# Patient Record
Sex: Male | Born: 1995 | Race: Black or African American | Hispanic: No | Marital: Single | Smoking: Current every day smoker
Health system: Southern US, Community
[De-identification: ages and names within clinical notes are randomized; demographics above are authoritative.]

## PROBLEM LIST (undated history)

## (undated) DIAGNOSIS — Q8500 Neurofibromatosis, unspecified: Secondary | ICD-10-CM

## (undated) HISTORY — PX: WISDOM TOOTH EXTRACTION: SHX21

---
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
## 2017-01-29 ENCOUNTER — Encounter (HOSPITAL_COMMUNITY): Payer: Self-pay | Admitting: Emergency Medicine

## 2017-01-29 ENCOUNTER — Emergency Department (HOSPITAL_COMMUNITY)
Admission: EM | Admit: 2017-01-29 | Discharge: 2017-01-29 | Disposition: A | Discharge status: Home / Self Care | Payer: Medicaid Other | Attending: Emergency Medicine | Admitting: Emergency Medicine

## 2017-01-29 DIAGNOSIS — X500XXA Overexertion from strenuous movement or load, initial encounter: Secondary | ICD-10-CM | POA: Diagnosis not present

## 2017-01-29 DIAGNOSIS — Z87891 Personal history of nicotine dependence: Secondary | ICD-10-CM | POA: Insufficient documentation

## 2017-01-29 DIAGNOSIS — Y929 Unspecified place or not applicable: Secondary | ICD-10-CM | POA: Insufficient documentation

## 2017-01-29 DIAGNOSIS — Y990 Civilian activity done for income or pay: Secondary | ICD-10-CM | POA: Diagnosis not present

## 2017-01-29 DIAGNOSIS — S46911A Strain of unspecified muscle, fascia and tendon at shoulder and upper arm level, right arm, initial encounter: Secondary | ICD-10-CM | POA: Diagnosis not present

## 2017-01-29 DIAGNOSIS — Z79899 Other long term (current) drug therapy: Secondary | ICD-10-CM | POA: Insufficient documentation

## 2017-01-29 DIAGNOSIS — S4991XA Unspecified injury of right shoulder and upper arm, initial encounter: Secondary | ICD-10-CM | POA: Diagnosis present

## 2017-01-29 DIAGNOSIS — Y939 Activity, unspecified: Secondary | ICD-10-CM | POA: Insufficient documentation

## 2017-01-29 MED ORDER — METHOCARBAMOL 500 MG PO TABS: 500.0000 mg | ORAL_TABLET | Freq: Two times a day (BID) | ORAL | 0 refills | Status: DC | PRN

## 2017-01-29 MED ORDER — METHOCARBAMOL 500 MG PO TABS
500.0000 mg | ORAL_TABLET | Freq: Once | ORAL | Status: AC
  Administered 2017-01-29: 500 mg via ORAL
  Filled 2017-01-29: qty 1

## 2017-01-29 MED ORDER — IBUPROFEN 800 MG PO TABS
800.0000 mg | ORAL_TABLET | Freq: Once | ORAL | Status: AC
  Administered 2017-01-29: 800 mg via ORAL
  Filled 2017-01-29: qty 1

## 2017-01-29 MED ORDER — IBUPROFEN 800 MG PO TABS: 800.0000 mg | ORAL_TABLET | Freq: Three times a day (TID) | ORAL | 0 refills | Status: DC | PRN

## 2017-01-29 NOTE — ED Triage Notes (Signed)
Pt states he thinks he pulled a muscle in his right arm yesterday while lifting a box at work  Pt states he has had pain in his right upper arm since

## 2017-01-29 NOTE — Discharge Instructions (Signed)
It was my pleasure taking care of you today!   Ibuprofen as needed for pain. Robaxin is your muscle relaxer.  Rest. Ice or heat to area.  Follow up with your primary doctor if symptoms do not improve over the next week.  Return to ER for new or worsening symptoms, any additional concerns.  COLD THERAPY DIRECTIONS:  Ice or gel packs can be used to reduce both pain and swelling. Ice is the most helpful within the first 24 to 48 hours after an injury or flareup from overusing a muscle or joint.  Ice is effective, has very few side effects, and is safe for most people to use.   If you expose your skin to cold temperatures for too long or without the proper protection, you can damage your skin or nerves. Watch for signs of skin damage due to cold.   HOME CARE INSTRUCTIONS  Follow these tips to use ice and cold packs safely.  Place a dry or damp towel between the ice and skin. A damp towel will cool the skin more quickly, so you may need to shorten the time that the ice is used.  For a more rapid response, add gentle compression to the ice.  Ice for no more than 10 to 20 minutes at a time. The bonier the area you are icing, the less time it will take to get the benefits of ice.  Check your skin after 5 minutes to make sure there are no signs of a poor response to cold or skin damage.  Rest 20 minutes or more in between uses.  Once your skin is numb, you can end your treatment. You can test numbness by very lightly touching your skin. The touch should be so light that you do not see the skin dimple from the pressure of your fingertip. When using ice, most people will feel these normal sensations in this order: cold, burning, aching, and numbness.

## 2017-01-29 NOTE — ED Notes (Signed)
PT DISCHARGED. INSTRUCTIONS AND PRESCRIPTIONS GIVEN. AAOX4. PT IN NO APPARENT DISTRESS WITH MILD PAIN. THE OPPORTUNITY TO ASK QUESTIONS WAS PROVIDED. 

## 2017-01-29 NOTE — ED Provider Notes (Signed)
[facility]-EMERGENCY DEPT Provider Note   CSN: [id number] Arrival date & time: 01/29/17  1925     History   Chief Complaint Chief Complaint  Patient presents with  . Arm Pain    HPI Adam Guzman is a 21 y.o. male.  The history is provided by the patient and medical records. No language interpreter was used.  Arm Pain    Adam Guzman is a 21 y.o. male who presents to the Emergency Department complaining of acute onset of right anterior shoulder / chest wall pain described as ache which began yesterday while lifting a box much too heavy for him at work. Patient states that it feels like he has pulled a muscle. He has tried otc pain medication with no relief. Worse when lifting or raising arm above head. Better when still. No numbness or tingling. No neck or back pain.   Past Medical History:  Diagnosis Date  . Neurofibromatosis [facility])     Patient Active Problem List   Diagnosis Date Noted  . Migraine without aura and without status migrainosus, not intractable 01/11/2016  . Neck pain on right side 07/13/2015  . Delayed milestones 12/10/2013  . 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 Problems with learning 12/10/2013  . GYNECOMASTIA, UNILATERAL 09/02/2007  . ECZEMA, ATOPIC DERMATITIS 10/24/2006    History reviewed. No pertinent surgical history.     Home Medications    Prior to Admission medications   Medication Sig Start Date End Date Taking? Authorizing Provider  amoxicillin (AMOXIL) 500 MG capsule Take 1 capsule (500 mg total) by mouth 2 (two) times daily. 09/01/16   Riki Sheer, PA-C  ibuprofen (ADVIL,MOTRIN) 800 MG tablet Take 1 tablet (800 mg total) by mouth every 8 (eight) hours as needed for moderate pain. 01/29/17   Ward, Chase Picket, PA-C  methocarbamol (ROBAXIN) 500 MG tablet Take 1 tablet (500 mg total) by mouth 2 (two) times daily as needed for muscle spasms. 01/29/17   Ward, Chase Picket, PA-C  tiZANidine (ZANAFLEX)  4 MG tablet Take 1 tablet for severe headache 07/04/16   Deetta Perla, MD    Family History Family History  Problem Relation Age of Onset  . Cancer Father        Died at 96    Social History Social History  Substance Use Topics  . Smoking status: Former Games developer  . Smokeless tobacco: Never Used  . Alcohol use No     Allergies   Patient has no known allergies.   Review of Systems Review of Systems  Musculoskeletal: Positive for myalgias. Negative for arthralgias.  Neurological: Negative for weakness and numbness.     Physical Exam Updated Vital Signs BP 122/61 (BP Location: Left Arm)   Pulse (!) 58   Temp 98.2 F (36.8 C) (Oral)   Resp 16   Ht 5\' 10"  (1.778 m)   Wt 74.4 kg (164 lb)   SpO2 100%   BMI 23.53 kg/m   Physical Exam  Constitutional: He appears well-developed and well-nourished. No distress.  HENT:  Head: Normocephalic and atraumatic.  Neck: Neck supple.  Cardiovascular: Normal rate, regular rhythm and normal heart sounds.   No murmur heard. Pulmonary/Chest: Effort normal and breath sounds normal. No respiratory distress. He has no wheezes. He has no rales.  Musculoskeletal:       Arms: Right shoulder with no bony tenderness. TTP as depicted in image. Full ROM. Negative Neer's. No swelling, erythema or ecchymosis present. No step-off,  crepitus, or deformity appreciated. 5/5 muscle strength of bilateral UE. 2+ radial pulse, sensation intact and all compartments soft.  Neurological: He is alert.  Skin: Skin is warm and dry.  Nursing note and vitals reviewed.    ED Treatments / Results  Labs (all labs ordered are listed, but only abnormal results are displayed) Labs Reviewed - No data to display  EKG  EKG Interpretation None       Radiology No results found.  Procedures Procedures (including critical care time)  Medications Ordered in ED Medications  ibuprofen (ADVIL,MOTRIN) tablet 800 mg (not administered)  methocarbamol  (ROBAXIN) tablet 500 mg (not administered)     Initial Impression / Assessment and Plan / ED Course  I have reviewed the triage vital signs and the nursing notes.  Pertinent labs & imaging results that were available during my care of the patient were reviewed by me and considered in my medical decision making (see chart for details).    Hughey L Sprunger is a 21 y.o. male who presents to ED for shoulder / chest wall pain which began yesterday at work when trying to lift a box much too heavy for him. On exam, patient with tenderness to palpation of anterior shoulder musculature. No bony tenderness. Full ROM and NVI. Offered x-ray, but do not believe any bony abnormality. Patient defers x-ray at this time. Pain managed in ED. Patient advised to follow up with PCP if symptoms persist for further evaluation. Rx for ibuprofen and robaxin given. Work note for tomorrow provided as he does heavy lifting. Return precautions discussed. All questions answered.   Final Clinical Impressions(s) / ED Diagnoses   Final diagnoses:  Muscle strain of right shoulder region, initial encounter    New Prescriptions New Prescriptions   IBUPROFEN (ADVIL,MOTRIN) 800 MG TABLET    Take 1 tablet (800 mg total) by mouth every 8 (eight) hours as needed for moderate pain.   METHOCARBAMOL (ROBAXIN) 500 MG TABLET    Take 1 tablet (500 mg total) by mouth 2 (two) times daily as needed for muscle spasms.     Ward, Chase PicketJaime Pilcher, PA-C 01/29/17 2226    Lorre NickAllen, Anthony, MD 02/01/17 [phone]

## 2017-02-01 ENCOUNTER — Encounter (HOSPITAL_COMMUNITY): Payer: Self-pay | Admitting: *Deleted

## 2017-02-01 ENCOUNTER — Emergency Department (HOSPITAL_COMMUNITY)
Admission: EM | Admit: 2017-02-01 | Discharge: 2017-02-01 | Disposition: A | Discharge status: Home / Self Care | Payer: Medicaid Other | Attending: Emergency Medicine | Admitting: Emergency Medicine

## 2017-02-01 ENCOUNTER — Emergency Department (HOSPITAL_COMMUNITY): Payer: Medicaid Other

## 2017-02-01 DIAGNOSIS — Z79899 Other long term (current) drug therapy: Secondary | ICD-10-CM | POA: Diagnosis not present

## 2017-02-01 DIAGNOSIS — Z87891 Personal history of nicotine dependence: Secondary | ICD-10-CM | POA: Diagnosis not present

## 2017-02-01 DIAGNOSIS — X500XXA Overexertion from strenuous movement or load, initial encounter: Secondary | ICD-10-CM | POA: Insufficient documentation

## 2017-02-01 DIAGNOSIS — M25511 Pain in right shoulder: Secondary | ICD-10-CM | POA: Diagnosis not present

## 2017-02-01 DIAGNOSIS — Y929 Unspecified place or not applicable: Secondary | ICD-10-CM | POA: Diagnosis not present

## 2017-02-01 DIAGNOSIS — Y990 Civilian activity done for income or pay: Secondary | ICD-10-CM | POA: Insufficient documentation

## 2017-02-01 DIAGNOSIS — Y939 Activity, unspecified: Secondary | ICD-10-CM | POA: Insufficient documentation

## 2017-02-01 MED ORDER — MELOXICAM 7.5 MG PO TABS: 7.5000 mg | ORAL_TABLET | Freq: Every day | ORAL | 0 refills | Status: DC

## 2017-02-01 NOTE — ED Triage Notes (Signed)
Pt complains of right shoulder pain for the past 4 days. Pt was seen 3 days ago and prescribed ibuprofen and robaxin, which he states has not been helping. 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

## 2017-02-01 NOTE — ED Provider Notes (Signed)
[facility]-EMERGENCY DEPT Provider Note   CSN: [id number] Arrival date & time: 02/01/17  1736  By signing my name below, I, Rosario AdieWilliam Andrew Hiatt, attest that this documentation has been prepared under the direction and in the presence of Mayah Urquidi, PA-C.  Electronically Signed: Rosario AdieWilliam Andrew Hiatt, ED Scribe. 02/01/17. 7:06 PM.  History   Chief Complaint Chief Complaint  Patient presents with  . Shoulder Pain   The history is provided by the patient and medical records. No language interpreter was used.    HPI Comments: Adam Guzman is a 21 y.o. male who presents to the Emergency Department complaining of persistent right shoulder beginning four days ago. Per pt, he lifts heavy objects for work and Monday following work his pain was present. No significant trauma or injury. He was seen in the ED for same three days for which he was prescribed Robaxin and Ibuprofen which he has been taking without significant relief of his pain. His pain is worse with ROM of the joint. No recent re-injury since being seen in the ED. No h/o prior injury to the shoulder or any procedures done. He denies numbness, or any other associated symptoms.    Past Medical History:  Diagnosis Date  . Neurofibromatosis [facility](HCC)    Patient Active Problem List   Diagnosis Date Noted  . Migraine without aura and without status migrainosus, not intractable 01/11/2016  . Neck pain on right side 07/13/2015  . Delayed milestones 12/10/2013  . 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 Problems with learning 12/10/2013  . GYNECOMASTIA, UNILATERAL 09/02/2007  . ECZEMA, ATOPIC DERMATITIS 10/24/2006   History reviewed. No pertinent surgical history.  Home Medications    Prior to Admission medications   Medication Sig Start Date End Date Taking? Authorizing Provider  amoxicillin (AMOXIL) 500 MG capsule Take 1 capsule (500 mg total) by mouth 2 (two) times daily. 09/01/16   Riki SheerYoung, Michelle G,  PA-C  ibuprofen (ADVIL,MOTRIN) 800 MG tablet Take 1 tablet (800 mg total) by mouth every 8 (eight) hours as needed for moderate pain. 01/29/17   Ward, Chase PicketJaime Pilcher, PA-C  meloxicam (MOBIC) 7.5 MG tablet Take 1 tablet (7.5 mg total) by mouth daily. 02/01/17   Rolondo Pierre, PA-C  methocarbamol (ROBAXIN) 500 MG tablet Take 1 tablet (500 mg total) by mouth 2 (two) times daily as needed for muscle spasms. 01/29/17   Ward, Chase PicketJaime Pilcher, PA-C  tiZANidine (ZANAFLEX) 4 MG tablet Take 1 tablet for severe headache 07/04/16   Deetta PerlaHickling, William H, MD   Family History Family History  Problem Relation Age of Onset  . Cancer Father        Died at 6733   Social History Social History  Substance Use Topics  . Smoking status: Former Games developermoker  . Smokeless tobacco: Never Used  . Alcohol use No   Allergies   Patient has no known allergies.  Review of Systems Review of Systems  Constitutional: Negative for chills and fever.  Musculoskeletal: Positive for arthralgias and myalgias.  Skin: Negative for color change and wound.  Neurological: Negative for numbness.   Physical Exam Updated Vital Signs BP (!) 152/72 (BP Location: Left Arm)   Pulse 81   Temp 98.4 F (36.9 C) (Oral)   Resp 16   SpO2 100%   Physical Exam  Constitutional: He appears well-developed and well-nourished. No distress.  HENT:  Head: Normocephalic and atraumatic.  Eyes: Conjunctivae and EOM are normal. No scleral icterus.  Neck: Normal range of  motion.  Pulmonary/Chest: Effort normal. No respiratory distress.  Musculoskeletal: He exhibits tenderness. He exhibits no edema or deformity.  Pain with abduction of the right shoulder. TTP of the right shoulder musculature but no bony tenderness, and no swelling, temperature change, or redness noted. No crepitus noted. Distal pulses 2+ bilaterally. Strength 5/5. Area appears neurovascularly intact.  Neurological: He is alert.  Skin: No rash noted. He is not diaphoretic.  Psychiatric: He has  a normal mood and affect.  Nursing note and vitals reviewed.  ED Treatments / Results  DIAGNOSTIC STUDIES: Oxygen Saturation is 100% on RA, normal by my interpretation.   COORDINATION OF CARE: 7:04 PM-Discussed next steps with pt. Pt verbalized understanding and is agreeable with the plan.   Labs (all labs ordered are listed, but only abnormal results are displayed) Labs Reviewed - No data to display  EKG  EKG Interpretation None      Radiology Dg Shoulder Right  Result Date: 02/01/2017 CLINICAL DATA:  Right shoulder pain following injury 4 days ago. Initial encounter. EXAM: RIGHT SHOULDER - 2+ VIEW COMPARISON:  None. FINDINGS: There is no evidence of fracture or dislocation. There is no evidence of arthropathy or other focal bone abnormality. Soft tissues are unremarkable. IMPRESSION: Negative. Electronically Signed   By: Harmon Pier M.D.   On: 02/01/2017 19:33    Procedures Procedures   Medications Ordered in ED Medications - No data to display  Initial Impression / Assessment and Plan / ED Course  I have reviewed the triage vital signs and the nursing notes.  Pertinent labs & imaging results that were available during my care of the patient were reviewed by me and considered in my medical decision making (see chart for details).     Patient presents to the ED for complaints of right shoulder pain due to overuse. He was seen in the ED approximately 3 days ago and was given Robaxin and ibuprofen. He states that neither of these are bringing much relief of his pain. He does heavy lifting for work and has been unable to return to work since then. He denies any reinjury or other, to the area. No concern for fracture or dislocation, but will obtain x-ray to verify. No concern for septic joint as patient is able to move the extremity passively and actively although experiencing pain. There is no temperature change, redness or significant swelling noted. There is no bony tenderness  present or crepitus. Patient is afebrile with no history of fever. X-rays negative for acute fracture or dislocation. I advised patient to continue taking Robaxin and will give Mobic to take in place of ibuprofen. I advised him to follow up with his PCP for further evaluation and referrals. Patient appears stable for discharge at this time with no other complaints. Strict return precautions given.  Final Clinical Impressions(s) / ED Diagnoses   Final diagnoses:  Right shoulder pain, unspecified chronicity   New Prescriptions New Prescriptions   MELOXICAM (MOBIC) 7.5 MG TABLET    Take 1 tablet (7.5 mg total) by mouth daily.   I personally performed the services described in this documentation, which was scribed in my presence. The recorded information has been reviewed and is accurate.     Dietrich Pates, PA-C 02/01/17 1940    Nira Conn, MD 02/02/17 [phone]

## 2017-02-01 NOTE — Discharge Instructions (Signed)
Take Mobic once daily as directed. Continue taking Robaxin as previously prescribed. Follow-up with PCP for further evaluation. Return to ED for worsening pain, shoulder injury, numbness, weakness, joint swelling or redness.

## 2017-02-08 ENCOUNTER — Emergency Department (HOSPITAL_COMMUNITY)
Admission: EM | Admit: 2017-02-08 | Discharge: 2017-02-08 | Disposition: A | Discharge status: Home / Self Care | Payer: Medicaid Other | Attending: Emergency Medicine | Admitting: Emergency Medicine

## 2017-02-08 ENCOUNTER — Encounter (HOSPITAL_COMMUNITY): Payer: Self-pay | Admitting: Emergency Medicine

## 2017-02-08 DIAGNOSIS — Z79899 Other long term (current) drug therapy: Secondary | ICD-10-CM | POA: Diagnosis not present

## 2017-02-08 DIAGNOSIS — G44209 Tension-type headache, unspecified, not intractable: Secondary | ICD-10-CM | POA: Diagnosis not present

## 2017-02-08 DIAGNOSIS — Z87891 Personal history of nicotine dependence: Secondary | ICD-10-CM | POA: Insufficient documentation

## 2017-02-08 DIAGNOSIS — X500XXD Overexertion from strenuous movement or load, subsequent encounter: Secondary | ICD-10-CM | POA: Diagnosis not present

## 2017-02-08 DIAGNOSIS — S46911D Strain of unspecified muscle, fascia and tendon at shoulder and upper arm level, right arm, subsequent encounter: Secondary | ICD-10-CM | POA: Diagnosis not present

## 2017-02-08 DIAGNOSIS — S4991XD Unspecified injury of right shoulder and upper arm, subsequent encounter: Secondary | ICD-10-CM | POA: Diagnosis present

## 2017-02-08 MED ORDER — DICLOFENAC SODIUM 75 MG PO TBEC: 75.0000 mg | DELAYED_RELEASE_TABLET | Freq: Two times a day (BID) | ORAL | 0 refills | Status: DC

## 2017-02-08 NOTE — ED Triage Notes (Signed)
Pt c/o right shoulder pain onset 01/28/17, seen twice in ED for the same. Yesterday was working lifting boxes and felt shoulder pull, followed by worsening of pain.  Pt c/o headache onset yesterday, states feels similar to past headaches which were diagnosed as migraine. 1 episode emesis last night. No weakness, vision change, photophobia, phonophobia.

## 2017-02-08 NOTE — ED Provider Notes (Signed)
[facility]-EMER[facility]ENCY DEPT Provider Note   CSN: [id number] Arrival date & time: 02/08/17  1029     History   Chief Complaint Chief Complaint  Patient presents with  . Headache  . Shoulder Pain    HPI Adam Guzman is a 21 y.o. male.  The history is provided by the patient. No language interpreter was used.  Headache   This is a new problem. The problem occurs constantly. The problem has been gradually worsening. The headache is associated with nothing. The pain is moderate. The pain does not radiate. He has tried nothing for the symptoms. The treatment provided no relief.  Shoulder Pain     Pt complains of pain in right shoulder.  Pt reports pain is worse.  Than when he initially injured shoulder on 6/4.  Past Medical History:  Diagnosis Date  . Neurofibromatosis [facility](HCC)     Patient Active Problem List   Diagnosis Date Noted  . Migraine without aura and without status migrainosus, not intractable 01/11/2016  . Neck pain on right side 07/13/2015  . Delayed milestones 12/10/2013  . 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 Problems with learning 12/10/2013  . GYNECOMASTIA, UNILATERAL 09/02/2007  . ECZEMA, ATOPIC DERMATITIS 10/24/2006    History reviewed. No pertinent surgical history.     Home Medications    Prior to Admission medications   Medication Sig Start Date End Date Taking? Authorizing Provider  amoxicillin (AMOXIL) 500 MG capsule Take 1 capsule (500 mg total) by mouth 2 (two) times daily. 09/01/16   Riki SheerYoung, Michelle G, PA-C  ibuprofen (ADVIL,MOTRIN) 800 MG tablet Take 1 tablet (800 mg total) by mouth every 8 (eight) hours as needed for moderate pain. 01/29/17   Ward, Chase PicketJaime Pilcher, PA-C  meloxicam (MOBIC) 7.5 MG tablet Take 1 tablet (7.5 mg total) by mouth daily. 02/01/17   Khatri, Hina, PA-C  methocarbamol (ROBAXIN) 500 MG tablet Take 1 tablet (500 mg total) by mouth 2 (two) times daily as needed for muscle spasms. 01/29/17   Ward,  Chase PicketJaime Pilcher, PA-C  tiZANidine (ZANAFLEX) 4 MG tablet Take 1 tablet for severe headache 07/04/16   Deetta PerlaHickling, William H, MD    Family History Family History  Problem Relation Age of Onset  . Cancer Father        Died at 6633    Social History Social History  Substance Use Topics  . Smoking status: Former Games developermoker  . Smokeless tobacco: Never Used  . Alcohol use No     Allergies   Patient has no known allergies.   Review of Systems Review of Systems  Neurological: Positive for headaches.  All other systems reviewed and are negative.    Physical Exam Updated Vital Signs BP (!) 120/55 (BP Location: Left Arm)   Pulse 74   Temp 98.2 F (36.8 C) (Oral)   Resp 16   SpO2 95%   Physical Exam  Constitutional: He appears well-developed and well-nourished.  HENT:  Head: Normocephalic and atraumatic.  Eyes: Conjunctivae are normal.  Neck: Neck supple.  Cardiovascular: Normal rate and regular rhythm.   No murmur heard. Pulmonary/Chest: Effort normal and breath sounds normal. No respiratory distress.  Abdominal: Soft. There is no tenderness.  Musculoskeletal: He exhibits no edema.  Pain with lifting right shoulder,  Pain with range of motion, nv and ns intact  Neurological: He is alert.  Skin: Skin is warm and dry.  Psychiatric: He has a normal mood and affect.  Nursing note and vitals reviewed.  ED Treatments / Results  Labs (all labs ordered are listed, but only abnormal results are displayed) Labs Reviewed - No data to display  EKG  EKG Interpretation None       Radiology No results found.  Procedures Procedures (including critical care time)  Medications Ordered in ED Medications - No data to display   Initial Impression / Assessment and Plan / ED Course  I have reviewed the triage vital signs and the nursing notes.  Pertinent labs & imaging results that were available during my care of the patient were reviewed by me and considered in my medical  decision making (see chart for details).       Final Clinical Impressions(s) / ED Diagnoses   Final diagnoses:  Strain of right shoulder, subsequent encounter  Tension-type headache, not intractable, unspecified chronicity pattern    New Prescriptions New Prescriptions   DICLOFENAC (VOLTAREN) 75 MG EC TABLET    Take 1 tablet (75 mg total) by mouth 2 (two) times daily.   This is pt's third visit for shoulder.  Pt injured on the job.   I advise pt he needs to see the Orthopaedist for evaluation   Osie Cheeks 02/08/17 1135    Rolland Porter, MD 02/11/17 [phone]

## 2017-02-08 NOTE — ED Notes (Signed)
Pt verbalizes worsening right shoulder/pain post lifting 105 lb box last night; worse with movement; pt has full ROM. Pt continues to verbalize hx of migraines and onset of 5/10 headache with associated nausea yesterday. Pt calm in NAD.

## 2017-03-20 ENCOUNTER — Ambulatory Visit (INDEPENDENT_AMBULATORY_CARE_PROVIDER_SITE_OTHER): Payer: Medicaid Other | Admitting: Pediatrics

## 2017-03-20 ENCOUNTER — Encounter (INDEPENDENT_AMBULATORY_CARE_PROVIDER_SITE_OTHER): Payer: Self-pay | Admitting: Pediatrics

## 2017-03-20 VITALS — BP 120/70 | HR 72 | Ht 69.5 in | Wt 163.2 lb

## 2017-03-20 DIAGNOSIS — G43009 Migraine without aura, not intractable, without status migrainosus: Secondary | ICD-10-CM

## 2017-03-20 DIAGNOSIS — Q8501 Neurofibromatosis, type 1: Secondary | ICD-10-CM | POA: Diagnosis not present

## 2017-03-20 DIAGNOSIS — M25519 Pain in unspecified shoulder: Secondary | ICD-10-CM | POA: Insufficient documentation

## 2017-03-20 DIAGNOSIS — M25512 Pain in left shoulder: Secondary | ICD-10-CM

## 2017-03-20 NOTE — Progress Notes (Signed)
Patient: Adam Guzman MRN: [id number] Sex: male DOB: 05-27-1996  Provider: Ellison CarwinWilliam Jillyn Stacey, MD [facility]  Note type: Routine return visit  History of Present Illness: Referral Source: Dr. Chales SalmonJanet Dees [facility] Chief Complaint: Neurofibromatosis Type 1/Learning Problems/Headaches  Adam Guzman is a 21 y.o. male who was seen on March 20, 2017, for the first time since July 04, 2016.  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  MRI scan has shown diminution of the basal ganglia signal on the right.  The last study was performed in May 2017.  Adam Guzman worked for [employer] and hurt his right shoulder.  I think that it is somewhat better but it caused him to leave the job.  Yesterday, he had sudden onset of pain in his left shoulder that appears to be at the acromioclavicular joint.  There is no loss of range of motion, no swelling or inflammation.  He did not injure himself.  He took some Excedrin which helped lessen the pain somewhat, but did not abolish it.  He has occasional migraine headaches and tension-type headaches for which he takes Excedrin.  There are more cutaneous neurofibromas now than he had when I saw him 6 months ago.  He thinks that the scalp plexiform neurofibroma is larger on the left.  I cannot tell for certain.    Since he quit his job with [employer], he is not working and has not yet begun to look for a job.  He is living at home with his family.  He graduated from [employer].  He tells me that on occasion he has trouble falling and staying asleep.  His appetite is good and he has gained only 2 pounds since I saw him last time.  Review of Systems: 12 system review was remarkable for two to three  headaches a month, lump on left side of head, left shoulder pain; the remainder was assessed and was negative  Past Medical History Diagnosis Date  . Neurofibromatosis (HCC)    Hospitalizations: No., Head Injury: No., Nervous System Infections: No., Immunizations up to date: Yes.    MRI scan of the brain from November 03, 2008 shows minimal T2 hyperintensity inferior to the anterior genu of the left internal capsule, No enhancement associated with this lesion. 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 Mucosal thickening in the ethmoid air cells and sphenoid sinuses bilaterally.  MRI of the cervical spine August 06, 2015 showed straightening of lordosis without spinal stenosis or nerve impingement no cervical neurofibromas were identified, no enlargement of the dorsal root ganglia.  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 No pertinent surgical history.  Family History family history includes Cancer in his father. Family history is negative for migraines, seizures, intellectual disabilities, blindness, deafness, birth defects, chromosomal disorder, or autism.  Social History Social History   Social History  .  Marital status: Single  . Years of education: 7613   Social History Main Topics  . Smoking status: Former Games developermoker  . Smokeless tobacco: Never Used  . Alcohol use No  . Drug use: No  . 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    He graduated from [employer].       He lives with his mother, brother, and step-dad.     He enjoys playing video games, riding his bike, watching TV, and parkour.   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 of his scalp Ears, Nose and Throat: Otoscopic: tympanic membranes normal; pharynx: oropharynx is pink without exudates or tonsillar hypertrophy Neck: supple, full range of motion, no cranial or cervical bruits Respiratory: auscultation clear Cardiovascular: no murmurs, pulses are normal Musculoskeletal: no skeletal deformities or apparent scoliosis Skin: Multiple caf au lait macules on his trunk and limbs; bilateral axillary freckles also multiple neurofibromas on his arms, right distal medial thigh above the knee; upper and lower back  Neurologic Exam  Mental Status: alert; oriented to person, place and year; knowledge is normal for age; language is normal Cranial Nerves: visual fields are full to double simultaneous stimuli; extraocular movements are full and conjugate; pupils are round reactive to light; funduscopic examination shows sharp disc margins with normal vessels; symmetric facial strength; midline tongue and uvula; air conduction is greater than bone conduction bilaterally Motor: Normal strength, tone and mass; good fine motor movements; no pronator drift Sensory: intact responses to cold, vibration, proprioception and stereognosis Coordination: good finger-to-nose, rapid repetitive alternating movements and finger apposition Gait and Station: normal gait and station: patient is able to walk on heels, toes and tandem without difficulty; balance is adequate; Romberg exam is negative; Gower response is  negative Reflexes: symmetric and diminished bilaterally; no clonus; bilateral flexor plantar responses  Assessment 1. 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 2. Migraine without aura without status migrainosus, not intractable, G43.009. 3. Pain in joint of left shoulder, M25.512.  Discussion I think that his neurofibromatosis is stable.  I do not think that imaging at this time is necessary.  I do think he should go to see an orthopedic surgeon.  He injured his right shoulder and now has spontaneous pain in the left shoulder that is bothersome to him.  I am not able to find anything wrong with it, but I think that it ought to be evaluated.  Plan I asked him to return to see me in a year but I will be happy to see him sooner based on clinical circumstances.  I spent 15 minutes of face-to-face time with Adam Guzman.   Medication List   Accurate as of 03/20/17 11:43 AM.      tiZANidine 4 MG tablet Commonly known as:  ZANAFLEX Take 1 tablet for severe headache    The medication list was reviewed and reconciled. All changes or newly prescribed medications were explained.  A complete medication list was provided to the patient/caregiver.  Deetta PerlaWilliam H Lin Hackmann MD

## 2017-03-20 NOTE — Patient Instructions (Signed)
I'm not certain why you're having pain in your shoulder.  I don't think this has anything to do with your neurofibromatosis.  If you continue to have pain in your shoulder, you probably need to be seen by an orthopedic surgeon.  If you have a primary doctor, they can make a referral.  Otherwise let me know and I can probably help.

## 2017-05-10 ENCOUNTER — Encounter (HOSPITAL_COMMUNITY): Payer: Self-pay | Admitting: Emergency Medicine

## 2017-05-10 ENCOUNTER — Emergency Department (HOSPITAL_COMMUNITY): Payer: Medicaid Other

## 2017-05-10 ENCOUNTER — Emergency Department (HOSPITAL_COMMUNITY)
Admission: EM | Admit: 2017-05-10 | Discharge: 2017-05-10 | Disposition: A | Discharge status: Home / Self Care | Payer: Medicaid Other | Attending: Physician Assistant | Admitting: Physician Assistant

## 2017-05-10 DIAGNOSIS — K047 Periapical abscess without sinus: Secondary | ICD-10-CM | POA: Diagnosis not present

## 2017-05-10 DIAGNOSIS — Z79899 Other long term (current) drug therapy: Secondary | ICD-10-CM | POA: Diagnosis not present

## 2017-05-10 DIAGNOSIS — Z87891 Personal history of nicotine dependence: Secondary | ICD-10-CM | POA: Insufficient documentation

## 2017-05-10 DIAGNOSIS — K0889 Other specified disorders of teeth and supporting structures: Secondary | ICD-10-CM | POA: Diagnosis present

## 2017-05-10 LAB — CBC WITH DIFFERENTIAL/PLATELET
BASOS PCT: 1 %
Basophils Absolute: 0 10*3/uL (ref 0.0–0.1)
EOS ABS: 0.1 10*3/uL (ref 0.0–0.7)
Eosinophils Relative: 2 %
HCT: 46.5 % (ref 39.0–52.0)
Hemoglobin: 16.1 g/dL (ref 13.0–17.0)
Lymphocytes Relative: 39 %
Lymphs Abs: 1.2 10*3/uL (ref 0.7–4.0)
MCH: 26.1 pg (ref 26.0–34.0)
MCHC: 34.6 g/dL (ref 30.0–36.0)
MCV: 75.2 fL — ABNORMAL LOW (ref 78.0–100.0)
MONO ABS: 0.3 10*3/uL (ref 0.1–1.0)
MONOS PCT: 10 %
NEUTROS PCT: 48 %
Neutro Abs: 1.5 10*3/uL — ABNORMAL LOW (ref 1.7–7.7)
Platelets: 176 10*3/uL (ref 150–400)
RBC: 6.18 MIL/uL — ABNORMAL HIGH (ref 4.22–5.81)
RDW: 12.4 % (ref 11.5–15.5)
WBC: 3 10*3/uL — ABNORMAL LOW (ref 4.0–10.5)

## 2017-05-10 LAB — BASIC METABOLIC PANEL
Anion gap: 6 (ref 5–15)
BUN: 14 mg/dL (ref 6–20)
CALCIUM: 9.6 mg/dL (ref 8.9–10.3)
CO2: 29 mmol/L (ref 22–32)
CREATININE: 0.97 mg/dL (ref 0.61–1.24)
Chloride: 102 mmol/L (ref 101–111)
GFR calc non Af Amer: >60 mL/min (ref >60)
Glucose, Bld: 86 mg/dL (ref 65–99)
Potassium: 4.1 mmol/L (ref 3.5–5.1)
SODIUM: 137 mmol/L (ref 135–145)

## 2017-05-10 MED ORDER — CLINDAMYCIN PHOSPHATE 600 MG/50ML IV SOLN
600.0000 mg | Freq: Once | INTRAVENOUS | Status: AC
  Administered 2017-05-10: 600 mg via INTRAVENOUS
  Filled 2017-05-10: qty 50

## 2017-05-10 MED ORDER — CLINDAMYCIN HCL 300 MG PO CAPS: 300.0000 mg | ORAL_CAPSULE | Freq: Three times a day (TID) | ORAL | 0 refills | Status: AC

## 2017-05-10 MED ORDER — TRAMADOL HCL 50 MG PO TABS
50.0000 mg | ORAL_TABLET | Freq: Once | ORAL | Status: AC
  Administered 2017-05-10: 50 mg via ORAL
  Filled 2017-05-10: qty 1

## 2017-05-10 MED ORDER — TRAMADOL HCL 50 MG PO TABS: 50.0000 mg | ORAL_TABLET | Freq: Four times a day (QID) | ORAL | 0 refills | Status: DC | PRN

## 2017-05-10 MED ORDER — SODIUM CHLORIDE 0.9 % IV BOLUS (SEPSIS)
1000.0000 mL | Freq: Once | INTRAVENOUS | Status: AC
  Administered 2017-05-10: 1000 mL via INTRAVENOUS

## 2017-05-10 MED ORDER — IOPAMIDOL (ISOVUE-300) INJECTION 61%
INTRAVENOUS | Status: AC
  Administered 2017-05-10: 75 mL
  Filled 2017-05-10: qty 75

## 2017-05-10 NOTE — ED Notes (Signed)
Bed: WTR7 Expected date:  Expected time:  Means of arrival:  Comments: 

## 2017-05-10 NOTE — ED Provider Notes (Signed)
[facility]ER[facility]ENCY DEPT Provider Note   CSN: [id number] Arrival date & time: 05/10/17  1120     History   Chief Complaint Chief Complaint  Patient presents with  . Dental Pain    HPI Adam Guzman is a 21 y.o. male with history of neurofibromatosis who presents emergency department today for a bump behind a tooth that he had filled 2 days ago. Patient states that he was seen at [facility] by Dr. Judithe Modest for a filling of a cavity of what appears to be tooth number 27 or 28. The patient tolerated the procedure well and was sent home without abx. The patient noticed a small "bump" on the inside of the mouth, along the gumline next to the tooth that he had work done for. He notes this is tender with palpation and also when he lies down on that side of his face. He took excedrin for this without much relief. When he woke up this morning the patient noted increased swelling of the area and was concerned, which is what brought him to the department today. He denies fever, chills, inability to control secretions, N/V, abdominal pain, drainage, voice change, extension of swelling into the neck or trauma. He has his follow up appointment on the 19th.    HPI  Past Medical History:  Diagnosis Date  . Neurofibromatosis [facility])     Patient Active Problem List   Diagnosis Date Noted  . Pain in joint, shoulder region 03/20/2017  . Migraine without aura and without status migrainosus, not intractable 01/11/2016  . Neck pain on right side 07/13/2015  . Delayed milestones 12/10/2013  . 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 Problems with learning 12/10/2013  . GYNECOMASTIA, UNILATERAL 09/02/2007  . ECZEMA, ATOPIC DERMATITIS 10/24/2006    Past Surgical History:  Procedure Laterality Date  . WISDOM TOOTH EXTRACTION         Home Medications    Prior to Admission medications   Medication Sig Start Date End Date Taking? Authorizing Provider  clindamycin  (CLEOCIN) 300 MG capsule Take 1 capsule (300 mg total) by mouth 3 (three) times daily. 05/10/17 05/17/17  Kavya Haag, Elmer Sow, PA-C  tiZANidine (ZANAFLEX) 4 MG tablet Take 1 tablet for severe headache 07/04/16   Deetta Perla, MD    Family History Family History  Problem Relation Age of Onset  . Cancer Father        Died at 85    Social History Social History  Substance Use Topics  . Smoking status: Former Games developer  . Smokeless tobacco: Never Used     Comment: vapor  . Alcohol use No     Allergies   Patient has no known allergies.   Review of Systems Review of Systems   Physical Exam Updated Vital Signs BP (!) 147/71 (BP Location: Right Arm)   Pulse 75   Temp 98.3 F (36.8 C) (Oral)   Resp 18   SpO2 100%   Physical Exam  Constitutional: He appears well-developed and well-nourished.  Non-toxic appearing  HENT:  Head: Normocephalic and atraumatic.  Right Ear: External ear normal.  Left Ear: External ear normal.  The patient has normal phonation and is in control of secretions. No stridor.  Midline uvula without edema. Soft palate rises symmetrically. No tonsillar erythema or exudates. No PTA. Tongue protrusion is normal. No trismus. No creptius on neck palpation. There is noted fluctuance and swelling of the medial gingiva from tooth numbers 27-29, extending into the sublingual  floor on the right side. No drainage or bleeding. No fracture or cavity of the teeth. Mucus membranes moist.   Eyes: Conjunctivae are normal. Right eye exhibits no discharge. Left eye exhibits no discharge. No scleral icterus.  Neck: Trachea normal, normal range of motion and phonation normal. Neck supple. No tracheal tenderness and no spinous process tenderness present. No neck rigidity. Normal range of motion present.  Pulmonary/Chest: Effort normal. No respiratory distress.  Neurological: He is alert.  Skin: No pallor.  Psychiatric: He has a normal mood and affect.  Nursing note and vitals  reviewed.  ED Treatments / Results  Labs (all labs ordered are listed, but only abnormal results are displayed) Labs Reviewed  CBC WITH DIFFERENTIAL/PLATELET - Abnormal; Notable for the following:       Result Value   WBC 3.0 (*)    RBC 6.18 (*)    MCV 75.2 (*)    Neutro Abs 1.5 (*)    All other components within normal limits  BASIC METABOLIC PANEL    EKG  EKG Interpretation None       Radiology Ct Soft Tissue Neck W Contrast  Result Date: 05/10/2017 CLINICAL DATA:  Recent dental filling with sublingual swelling and difficulty swallowing. Fluctuance extending into the floor of mouth. History of neurofibromatosis. EXAM: CT NECK WITH CONTRAST TECHNIQUE: Multidetector CT imaging of the neck was performed using the standard protocol following the bolus administration of intravenous contrast. CONTRAST:  75mL ISOVUE-300 IOPAMIDOL (ISOVUE-300) INJECTION 61% COMPARISON:  None. FINDINGS: Pharynx and larynx: There is a 3.4 x 1.1 cm low-density fluid collection along the lingual surface of the mandible which displaces the mylohyoid muscle medially. No erosive changes are seen involving the adjacent mandible. There is mass effect on the floor of mouth with partial effacement of the right sublingual space. The pharynx and larynx are unremarkable. Salivary glands: No mass, inflammatory changes, or stone. Slight asymmetric prominence of the right submandibular duct likely due to compression anteriorly in the floor of mouth. Thyroid: Unremarkable. Lymph nodes: No enlarged or suspicious lymph nodes in the neck. Vascular: Major vascular structures of the neck appear patent. Limited intracranial: Unremarkable. Visualized orbits: Unremarkable. Mastoids and visualized paranasal sinuses: Clear. Skeleton: No acute osseous abnormality or suspicious osseous lesion. 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 Upper chest: Clear lung apices. Other: None. IMPRESSION: 3.4 cm abscess along the  lingual surface of the right mandible. Electronically Signed   By: Sebastian Ache M.D.   On: 05/10/2017 13:17    Procedures Procedures (including critical care time)  Medications Ordered in ED Medications  sodium chloride 0.9 % bolus 1,000 mL (1,000 mLs Intravenous New Bag/Given 05/10/17 1301)  clindamycin (CLEOCIN) IVPB 600 mg (0 mg Intravenous Stopped 05/10/17 1345)  iopamidol (ISOVUE-300) 61 % injection (75 mLs  Contrast Given 05/10/17 1243)     Initial Impression / Assessment and Plan / ED Course  I have reviewed the triage vital signs and the nursing notes.  Pertinent labs & imaging results that were available during my care of the patient were reviewed by me and considered in my medical decision making (see chart for details).     This is a 21 year old male presenting after filling for a tooth with 2 day history of swelling on inside of gumline. Patient is noted to have fluctuance and swelling of the medial gingiva from tooth numbers 27-29, extending into the sublingual floor on the right side. He is in control of his secretions. There is  not swelling or crepitus of the neck. No trismus. Vital signs are reassuring and without fever, tachycardia, tachypnea, or hypoxia. Due to the concern of spread of abscess/Ludwig's will order CT imaging with contrast. I called radiology who advised CT w/ contrast soft tissue neck only for evaluation. CBC and BMP ordered. Patient given IVF and IV Clindamycin.   BMP unremarkable. Patient with normal kidney function. No electrolyte abnormality. CBC with decreased WBC (3.0) and neutrophils (1.5). CT scan shows 3.4 x 1.1 cm low-density fluid collection along the lingual surface of the mandible which displaces the mylohyoid muscle medially. Will place consult to oral surgery.   Dr. Barbette Merino of oral surgery returned consultation call. He evaluated the CT scan and recommended the patient be sent home on Clindamycin  TID with follow up on Monday at 8:00 in the  office. Will provide the patient small amount of pain medication until he is seen by oral surgery on Monday. Advised him this is only for breakthrough pain. Patient reviewed in [place] Controlled Substance Reporting System.   I advised the patient to return to the emergency department with new or worsening symptoms or new concerns. Specific return precautions discussed and stressed to the patient. The patient verbalized understanding and agreement with plan. He was able to reciprocate reasons for return. All questions answered. No further questions at this time. The patient is hemodynamically stable, mentating appropriately and appears safe for discharge.  Patient case seen and discussed with Dr. Corlis Leak who is in agreement with plan.   Final Clinical Impressions(s) / ED Diagnoses   Final diagnoses:  Dental abscess    New Prescriptions New Prescriptions   CLINDAMYCIN (CLEOCIN) 300 MG CAPSULE    Take 1 capsule (300 mg total) by mouth 3 (three) times daily.     Jacinto Halim, PA-C 05/10/17 1440    Jacinto Halim, PA-C 05/10/17 1509    Abelino Derrick, MD 05/12/17 [phone]

## 2017-05-10 NOTE — ED Triage Notes (Signed)
Pt staged that her feels a bump behind the tooth that he had filled 2 days ago. Denies drainage. Taking Excedrin for aching without relief

## 2017-05-10 NOTE — Discharge Instructions (Signed)
Please read and follow all provided instructions.  Your diagnoses today include:  1. Dental abscess     Tests performed today include: Vital signs. See below for your results today.  Blood work CT scan  Medications prescribed:  Please take all of your antibiotics until finished!   You may develop abdominal discomfort or diarrhea from the antibiotic.  You may help offset this with probiotics which you can buy or get in yogurt. Do not eat  or take the probiotics until 2 hours after your antibiotic.   Home care instructions:  Follow any educational materials contained in this packet.  Follow-up instructions: Please follow-up with Dr. Barbette Merino at [facility] Monday morning. This is very important.   Return instructions:  Please return to the Emergency Department if you do not get better, if you get worse, or new symptoms OR You have a fever or chills. Your symptoms suddenly get worse. You have problems breathing  You have difficulty swallowing or handling your secretions (drooling). You have trouble opening your mouth. You have swelling in your neck or up around your eye. Please return if you have any other emergent concerns.  Additional Information:  Your vital signs today were: BP (!) 147/71 (BP Location: Right Arm)    Pulse 75    Temp 98.3 F (36.8 C) (Oral)    Resp 18    SpO2 100%  If your blood pressure (BP) was elevated above 135/85 this visit, please have this repeated by your doctor within one month.

## 2018-01-01 ENCOUNTER — Ambulatory Visit (INDEPENDENT_AMBULATORY_CARE_PROVIDER_SITE_OTHER): Payer: Medicaid Other | Admitting: Pediatrics

## 2018-01-01 ENCOUNTER — Encounter (INDEPENDENT_AMBULATORY_CARE_PROVIDER_SITE_OTHER): Payer: Self-pay | Admitting: Pediatrics

## 2018-01-01 VITALS — BP 120/80 | HR 68 | Ht 70.5 in | Wt 161.6 lb

## 2018-01-01 DIAGNOSIS — G43009 Migraine without aura, not intractable, without status migrainosus: Secondary | ICD-10-CM

## 2018-01-01 DIAGNOSIS — Z72821 Inadequate sleep hygiene: Secondary | ICD-10-CM

## 2018-01-01 DIAGNOSIS — Q8501 Neurofibromatosis, type 1: Secondary | ICD-10-CM | POA: Diagnosis not present

## 2018-01-01 NOTE — Progress Notes (Signed)
Patient: Adam Guzman MRN: [id number] Sex: male DOB: July 11, 1996  Provider: Ellison Carwin, MD Location of Care: [facility]  Note type: Routine return visit  History of Present Illness: Referral Source: Dr. Chales Salmon History from: patient and [facility] chart Chief Complaint: Neurofibromatosis Type 1/Learning Problems/Headaches  Adam Guzman is a 22 y.o. male who returns on Jan 01, 2018 for the first time since March 20, 2017.  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  MRI scan of the brain shows diminution of the basal ganglia signal on the right.  His last study was in May 2017.    Adam Guzman injured his right shoulder on-the-job while working for [employer].  He quit and is currently between jobs.  He no longer has pain in the shoulder.  He had a number of complaints today, which we discussed.  On occasion, when he swallows, he has sharp pain in his right throat.  This is infrequent and does not seem to be related to illness or injury.  He has right-sided pounding headaches that occur once a week and only last for minutes in duration.  He has 2 small neurofibromas in the right leg on his thigh above the knee that are painful when pressure is put on them.  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  It is clear that he has a steady increase in neurofibromata, however, they are all subcutaneous.  Adam Guzman is a [employer].  He lives with his mother and stepfather.  He has very poor sleep hygiene.  His general health is good.  There have been no focal neurologic deficits, no seizures.  He takes no medication.  Review of Systems: A complete review of systems was remarkable for patient states that he is having some pain when he swallows on his right side, he has bumps that are painful when he puts  pressure on them, he is still concerned with the lump on the left side of his head, he is having one headache a week, all other systems reviewed and negative.  Past Medical History Diagnosis Date  . Neurofibromatosis (HCC)    Hospitalizations: No., Head Injury: No., Nervous System Infections: No., Immunizations up to date: Yes.    MRI scan of the brain from November 03, 2008 shows minimal T2 hyperintensity inferior to the anterior genu of the left internal capsule, No enhancement associated with this lesion. 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 Mucosal thickening in the ethmoid air cells and sphenoid sinuses bilaterally.  MRI of the cervical spine August 06, 2015 showed straightening of lordosis without spinal stenosis or nerve impingement no cervical neurofibromas were identified, no enlargement of the dorsal root ganglia.  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 WISDOM TOOTH EXTRACTION     Family History family history includes Cancer in his father. Family history is negative for migraines, seizures, intellectual disabilities, blindness, deafness, birth defects, chromosomal disorder, or autism.  Social  History Social Needs  . Financial resource strain: Not on file  . Food insecurity:    Worry: Not on file    Inability: Not on file  . Transportation needs:    Medical: Not on file    Non-medical: Not on file  Tobacco Use  . Smoking status: Former Games developer  . Tobacco comment: vaper  Substance and Sexual  Activity  . Alcohol use: No    Alcohol/week: 0.0 oz  . Drug use: No  . 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    He graduated from [employer].     He lives with his mother, brother, and step-dad.     He enjoys playing video games, riding his bike, watching TV, and parkour.   No Known Allergies  Physical Exam BP 120/80   Pulse 68   Ht 5' 10.5" (1.791 m)   Wt 161 lb 9.6 oz (73.3 kg)   BMI 22.86 kg/m   General: alert, well developed, well nourished, in no acute distress, black hair, brown eyes, right handed Head: macrocephalic, prominent jaw Ears, Nose and Throat: Otoscopic: tympanic membranes normal; pharynx: oropharynx is pink without exudates or tonsillar hypertrophy Neck: supple, full range of motion, no cranial or cervical bruits Respiratory: auscultation clear Cardiovascular: no murmurs, pulses are normal Musculoskeletal: no skeletal deformities or apparent scoliosis Skin: Multiple caf au lait macules of different sizes on his trunk and limbs, bilateral axillary freckles, 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, multiple soft less than 1 cm subcutaneous neurofibromas on his arms legs, right greater than left and upper and lower back; 2 of them in the distal extensor surface of the thigh are tender to palpation  Neurologic Exam  Mental Status: alert; oriented to person, place and year; knowledge is normal for age; language is normal Cranial Nerves: visual fields are full to double simultaneous stimuli; extraocular movements are full and conjugate; pupils are round reactive to light; funduscopic examination shows sharp disc margins with normal vessels; symmetric facial strength; midline tongue and uvula; air conduction is greater than bone conduction bilaterally Motor: Normal strength, tone and mass; good fine motor movements; no pronator drift Sensory: intact responses to cold, vibration,  proprioception and stereognosis Coordination: good finger-to-nose, rapid repetitive alternating movements and finger apposition Gait and Station: normal gait and station: patient is able to walk on heels, toes and tandem without difficulty; balance is adequate; Romberg exam is negative; Gower response is negative Reflexes: symmetric and diminished bilaterally; no clonus; bilateral flexor plantar responses   Assessment 1. 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 2. Migraine without aura without status migrainosus, not intractable, G43.009. 3. Poor sleep hygiene, Z72.821  Discussion Neurofibromatosis is stable other than emergence of subcutaneous neurofibromas.  Though his headaches have migrainous qualities, they are very brief and not debilitating.  Some of his neurofibromas has become tactilely painful.  I explained to him that there was nothing that we could do to provide respite.  His headaches are extremely brief to the point where a diagnosis of migraine variant with almost be more appropriate than migraine without aura.  There is no reason at this time to treat him with preventative medication or to use over-the-counter or prescription abortive medication.  I am concerned that his poor sleep hygiene is going to interfere with his ability to get a job.  He is physically healthy and should be gainfully employed.  Despite his poor sleep hygiene, his weight is  stable.  He has actually lost a pound and a half.  At the same time, he appeared to gain an inch.  Plan I will see him in 1 year for routine visit.  At some point, I am going to transfer his care to an adult neurologist.  I spent 15 minutes of face-to-face time with Brodric assessing him and discussing the issues noted above.   Medication List  No prescribed medications.   The medication list was reviewed and reconciled. All changes or newly prescribed medications were explained.  A complete medication list was provided to the  patient/caregiver.  Deetta Perla MD

## 2018-01-02 DIAGNOSIS — Z72821 Inadequate sleep hygiene: Secondary | ICD-10-CM | POA: Insufficient documentation

## 2018-04-22 ENCOUNTER — Encounter (HOSPITAL_COMMUNITY): Payer: Self-pay

## 2018-04-22 ENCOUNTER — Emergency Department (HOSPITAL_COMMUNITY)
Admission: EM | Admit: 2018-04-22 | Discharge: 2018-04-22 | Disposition: A | Discharge status: Home / Self Care | Payer: Medicaid Other | Attending: Emergency Medicine | Admitting: Emergency Medicine

## 2018-04-22 ENCOUNTER — Other Ambulatory Visit: Payer: Self-pay

## 2018-04-22 DIAGNOSIS — J45909 Unspecified asthma, uncomplicated: Secondary | ICD-10-CM | POA: Diagnosis not present

## 2018-04-22 DIAGNOSIS — R070 Pain in throat: Secondary | ICD-10-CM | POA: Diagnosis present

## 2018-04-22 DIAGNOSIS — Z87891 Personal history of nicotine dependence: Secondary | ICD-10-CM | POA: Diagnosis not present

## 2018-04-22 DIAGNOSIS — R0981 Nasal congestion: Secondary | ICD-10-CM | POA: Diagnosis not present

## 2018-04-22 DIAGNOSIS — J029 Acute pharyngitis, unspecified: Secondary | ICD-10-CM | POA: Diagnosis not present

## 2018-04-22 LAB — GROUP A STREP BY PCR: GROUP A STREP BY PCR: NOT DETECTED

## 2018-04-22 MED ORDER — PREDNISONE 20 MG PO TABS: ORAL_TABLET | ORAL | 0 refills | Status: DC

## 2018-04-22 MED ORDER — LORATADINE 10 MG PO TABS: 10.0000 mg | ORAL_TABLET | Freq: Every day | ORAL | 0 refills | Status: DC

## 2018-04-22 MED ORDER — ALBUTEROL SULFATE HFA 108 (90 BASE) MCG/ACT IN AERS
1.0000 | INHALATION_SPRAY | RESPIRATORY_TRACT | Status: DC | PRN
  Administered 2018-04-22: 1 via RESPIRATORY_TRACT
  Filled 2018-04-22: qty 6.7

## 2018-04-22 MED ORDER — AEROCHAMBER Z-STAT PLUS/MEDIUM MISC
Status: AC
  Administered 2018-04-22: 10:00:00
  Filled 2018-04-22: qty 1

## 2018-04-22 NOTE — ED Provider Notes (Signed)
[facility]-EMERGENCY DEPT Provider Note   CSN: [id number] Arrival date & time: 04/22/18  0856     History   Chief Complaint Chief Complaint  Patient presents with  . Sore Throat    HPI Adam Guzman is a 22 y.o. male.  HPI Patient presents with nasal congestion, sore throat and some shortness of breath.  No cough, fever or chills.  No known new environmental exposures.  Has occasional chest tightness but denies any currently.  No history of asthma.  Patient does have history of eczema. Past Medical History:  Diagnosis Date  . Neurofibromatosis [facility])     Patient Active Problem List   Diagnosis Date Noted  . Poor sleep hygiene 01/02/2018  . Pain in joint, shoulder region 03/20/2017  . Migraine without aura and without status migrainosus, not intractable 01/11/2016  . Neck pain on right side 07/13/2015  . Delayed milestones 12/10/2013  . 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 Problems with learning 12/10/2013  . GYNECOMASTIA, UNILATERAL 09/02/2007  . ECZEMA, ATOPIC DERMATITIS 10/24/2006    Past Surgical History:  Procedure Laterality Date  . WISDOM TOOTH EXTRACTION          Home Medications    Prior to Admission medications   Medication Sig Start Date End Date Taking? Authorizing Provider  loratadine (CLARITIN) 10 MG tablet Take 1 tablet (10 mg total) by mouth daily. 04/22/18   Loren Racer, MD  predniSONE (DELTASONE) 20 MG tablet 3 tabs po day one, then 2 po daily x 4 days 04/22/18   Loren Racer, MD    Family History Family History  Problem Relation Age of Onset  . Cancer Father        Died at 89    Social History Social History   Tobacco Use  . Smoking status: Former Games developer  . Smokeless tobacco: Never Used  . Tobacco comment: vapor  Substance Use Topics  . Alcohol use: No    Alcohol/week: 0.0 standard drinks  . Drug use: No     Allergies   Patient has no known  allergies.   Review of Systems Review of Systems  Constitutional: Negative for chills and fever.  HENT: Positive for congestion, sinus pressure and sore throat. Negative for trouble swallowing.   Respiratory: Positive for shortness of breath. Negative for cough and wheezing.   Cardiovascular: Negative for chest pain, palpitations and leg swelling.  Gastrointestinal: Negative for abdominal pain, nausea and vomiting.  Musculoskeletal: Negative for back pain, neck pain and neck stiffness.  Skin: Negative for rash and wound.  Neurological: Negative for weakness, numbness and headaches.  All other systems reviewed and are negative.    Physical Exam Updated Vital Signs BP 109/69   Pulse 67   Temp (!) 97.5 F (36.4 C) (Oral)   Resp 16   Ht 5\' 9"  (1.753 m)   Wt 83.9 kg   SpO2 100%   BMI 27.32 kg/m   Physical Exam  Constitutional: He is oriented to person, place, and time. He appears well-developed and well-nourished. No distress.  HENT:  Head: Normocephalic and atraumatic.  Mouth/Throat: Oropharynx is clear and moist.  Bilateral nasal mucosal edema.  Bilateral tonsillar hypertrophy with mild erythema.  No tonsillar exudates.  Uvula is midline.  Eyes: Pupils are equal, round, and reactive to light. EOM are normal.  Neck: Normal range of motion. Neck supple. No JVD present.  No meningismus  Cardiovascular: Normal rate and regular rhythm. Exam reveals no gallop  and no friction rub.  No murmur heard. Pulmonary/Chest: Effort normal.  Prolonged expiratory phase  Abdominal: Soft. Bowel sounds are normal. There is no tenderness. There is no rebound and no guarding.  Musculoskeletal: Normal range of motion. He exhibits no edema or tenderness.  No lower extremity swelling, asymmetry or tenderness.  Lymphadenopathy:    He has no cervical adenopathy.  Neurological: He is alert and oriented to person, place, and time.  Moving all extremities without focal deficit.  Sensation fully intact.   Skin: Skin is warm and dry. Capillary refill takes less than 2 seconds. No rash noted. He is not diaphoretic. No erythema.  Psychiatric: He has a normal mood and affect. His behavior is normal.  Nursing note and vitals reviewed.    ED Treatments / Results  Labs (all labs ordered are listed, but only abnormal results are displayed) Labs Reviewed  GROUP A STREP BY PCR    EKG None  Radiology No results found.  Procedures Procedures (including critical care time)  Medications Ordered in ED Medications  albuterol (PROVENTIL HFA;VENTOLIN HFA) 108 (90 Base) MCG/ACT inhaler 1-2 puff (1 puff Inhalation Given 04/22/18 0946)  aerochamber Z-Stat Plus/medium (  Given 04/22/18 [id number])     Initial Impression / Assessment and Plan / ED Course  I have reviewed the triage vital signs and the nursing notes.  Pertinent labs & imaging results that were available during my care of the patient were reviewed by me and considered in my medical decision making (see chart for details).     Strep negative.  Suspect viral upper respiratory infection versus allergies/RAD.  Will start on Claritin and short course of prednisone.  Advised to take Tylenol and ibuprofen as needed for pain.  Final Clinical Impressions(s) / ED Diagnoses   Final diagnoses:  Nasal sinus congestion  Acute pharyngitis, unspecified etiology  Mild reactive airways disease, unspecified whether persistent    ED Discharge Orders         Ordered    predniSONE (DELTASONE) 20 MG tablet     04/22/18 1059    loratadine (CLARITIN) 10 MG tablet  Daily     04/22/18 1059           Loren RacerYelverton, Menaal Russum, MD 04/22/18 1100

## 2018-04-22 NOTE — ED Triage Notes (Signed)
patient c/o sore throat since yesterday.

## 2018-08-03 ENCOUNTER — Encounter (HOSPITAL_COMMUNITY): Payer: Self-pay | Admitting: Emergency Medicine

## 2018-08-03 ENCOUNTER — Other Ambulatory Visit: Payer: Self-pay

## 2018-08-03 ENCOUNTER — Emergency Department (HOSPITAL_COMMUNITY): Payer: Medicaid Other

## 2018-08-03 ENCOUNTER — Emergency Department (HOSPITAL_COMMUNITY)
Admission: EM | Admit: 2018-08-03 | Discharge: 2018-08-03 | Disposition: A | Discharge status: Home / Self Care | Payer: Medicaid Other | Attending: Emergency Medicine | Admitting: Emergency Medicine

## 2018-08-03 DIAGNOSIS — M545 Low back pain, unspecified: Secondary | ICD-10-CM

## 2018-08-03 DIAGNOSIS — J069 Acute upper respiratory infection, unspecified: Secondary | ICD-10-CM | POA: Diagnosis not present

## 2018-08-03 DIAGNOSIS — Z87891 Personal history of nicotine dependence: Secondary | ICD-10-CM | POA: Insufficient documentation

## 2018-08-03 DIAGNOSIS — J029 Acute pharyngitis, unspecified: Secondary | ICD-10-CM | POA: Diagnosis present

## 2018-08-03 LAB — GROUP A STREP BY PCR: Group A Strep by PCR: NOT DETECTED

## 2018-08-03 MED ORDER — FLUTICASONE PROPIONATE 50 MCG/ACT NA SUSP: 1.0000 | Freq: Every day | NASAL | 0 refills | Status: DC

## 2018-08-03 MED ORDER — GUAIFENESIN 100 MG/5ML PO SYRP: 100.0000 mg | ORAL_SOLUTION | ORAL | 0 refills | Status: DC | PRN

## 2018-08-03 MED ORDER — ACETAMINOPHEN 325 MG PO TABS
650.0000 mg | ORAL_TABLET | Freq: Once | ORAL | Status: AC
  Administered 2018-08-03: 650 mg via ORAL
  Filled 2018-08-03: qty 2

## 2018-08-03 NOTE — ED Notes (Signed)
Pt in xray

## 2018-08-03 NOTE — ED Triage Notes (Signed)
Pt c/o sore throat, congestion, back pain and headache since last Thursday.

## 2018-08-03 NOTE — Discharge Instructions (Addendum)
Evaluated today for sore throat, cough, nasal congestion, back pain.  Your x-rays were negative.  Your strep test was negative.  Your symptoms are most likely related to an upper respiratory infection.  I have prescribed you medicines to help with this.  You may take Tylenol and ibuprofen for pain.  Take as prescribed.  X-ray of your back was negative.  This most likely musculoskeletal nature.  Please apply heat to the area.  Follow-up with your PCP for reevaluation if you have continued symptoms.  Return to the ED for any new or worsening symptoms.

## 2018-08-03 NOTE — ED Provider Notes (Signed)
[facility]-EMERGENCY DEPT Provider Note   CSN: [id number] Arrival date & time: 08/03/18  0944   History   Chief Complaint Chief Complaint  Patient presents with  . sore throat  . Nasal Congestion  . Back Pain  . Headache    HPI Adam Guzman is a 22 y.o. male with medical history significant for neurofibromatosis who presents for evaluation of multiple complaints. States he has had sore throat x 4 days as well as productive cough. States it hurts to swallow food however is able to tolerate liquids and oral secretions. Admits to nasal congestion and rhinorrhea and frontal sinus pressure. Rhinorrhea is yellow in color. States his cough is worse when he lays down at night. Denies leg swelling, PND, orthopnea. Admits to back pain 5 days ago. States he was lifting "something heavy and heard a pop in my lower back." States pain had mostly resolved however "I want to make sure I didn't do anything bad." Denies radiation of pain.  Rates his pain a 0/10.  Has not had to take anything for his pain.  Denies history of IV drug use, history of malignancy, or bladder incontinence, saddle paresthesia, fever, chills, headache, blurred vision, neck pain, neck stiffness, difficulty tolerating oral secretions, chest pain, shortness of breath, abdominal pain, numbness or tingling his extremities, slurred speech, weakness..  Denies alleviating or aggravating or aggravating factors. Admits to multiple sick contacts. Received influenza vaccine.  History obtained from patient.  No interpreter was used.  HPI  Past Medical History:  Diagnosis Date  . Neurofibromatosis [facility])     Patient Active Problem List   Diagnosis Date Noted  . Poor sleep hygiene 01/02/2018  . Pain in joint, shoulder region 03/20/2017  . Migraine without aura and without status migrainosus, not intractable 01/11/2016  . Neck pain on right side 07/13/2015  . Delayed milestones 12/10/2013  . Neurofibromatosis, type  1 (von Recklinghausen's disease) (HCC) 12/10/2013  . Problems with learning 12/10/2013  . GYNECOMASTIA, UNILATERAL 09/02/2007  . ECZEMA, ATOPIC DERMATITIS 10/24/2006    Past Surgical History:  Procedure Laterality Date  . WISDOM TOOTH EXTRACTION          Home Medications    Prior to Admission medications   Medication Sig Start Date End Date Taking? Authorizing Provider  fluticasone (FLONASE) 50 MCG/ACT nasal spray Place 1 spray into both nostrils daily. 08/03/18   Lenna Hagarty A, PA-C  guaifenesin (ROBITUSSIN) 100 MG/5ML syrup Take 5-10 mLs (100-200 mg total) by mouth every 4 (four) hours as needed for cough. 08/03/18   Hasina Kreager A, PA-C    Family History Family History  Problem Relation Age of Onset  . Cancer Father        Died at 66    Social History Social History   Tobacco Use  . Smoking status: Former Games developer  . Smokeless tobacco: Never Used  . Tobacco comment: vapor  Substance Use Topics  . Alcohol use: No    Alcohol/week: 0.0 standard drinks  . Drug use: No     Allergies   Patient has no known allergies.   Review of Systems Review of Systems  Constitutional: Negative.   HENT: Positive for congestion, postnasal drip, rhinorrhea, sinus pressure and sore throat. Negative for dental problem, drooling, ear discharge, ear pain, facial swelling, hearing loss, mouth sores, nosebleeds, sinus pain, sneezing, tinnitus, trouble swallowing and voice change.   Respiratory: Negative.   Cardiovascular: Negative.   Gastrointestinal: Negative.   Genitourinary: Negative.  Musculoskeletal: Negative for neck pain and neck stiffness.  Skin: Negative.   Neurological: Negative for dizziness, tremors, syncope, facial asymmetry, weakness, light-headedness, numbness and headaches.  All other systems reviewed and are negative.    Physical Exam Updated Vital Signs BP (!) 128/95 (BP Location: Right Arm)   Pulse 91   Temp 100.1 F (37.8 C) (Oral)   Resp 18   SpO2  100%   Physical Exam  Constitutional: He appears well-developed and well-nourished.  Non-toxic appearance. He does not have a sickly appearance. He does not appear ill. No distress.  HENT:  Head: Normocephalic and atraumatic.  Right Ear: Tympanic membrane, external ear and ear canal normal. No drainage, swelling or tenderness. Tympanic membrane is not scarred, not perforated, not erythematous, not retracted and not bulging.  Left Ear: Tympanic membrane, external ear and ear canal normal. No drainage, swelling or tenderness. Tympanic membrane is not scarred, not perforated, not erythematous, not retracted and not bulging.  Nose: Rhinorrhea present. No sinus tenderness. Right sinus exhibits frontal sinus tenderness. Right sinus exhibits no maxillary sinus tenderness. Left sinus exhibits frontal sinus tenderness. Left sinus exhibits no maxillary sinus tenderness.  Mouth/Throat: Uvula is midline and mucous membranes are normal. No oral lesions. No trismus in the jaw. No uvula swelling. Posterior oropharyngeal erythema present. No oropharyngeal exudate, posterior oropharyngeal edema or tonsillar abscesses. No tonsillar exudate.  Posterior oropharynx with erythema, without exudate or edema.  Tonsils without edema or exudate.  No oropharyngeal erythema or swelling.  Phonation normal.  No evidence of oral lesions.  Neck: Normal range of motion, full passive range of motion without pain and phonation normal. Neck supple. No spinous process tenderness and no muscular tenderness present. No neck rigidity. No edema, no erythema and normal range of motion present. No Brudzinski's sign and no Kernig's sign noted.  Full ROM without pain    Cardiovascular: Normal rate, normal heart sounds, intact distal pulses and normal pulses.  No murmur heard. Pulses:      Dorsalis pedis pulses are 2+ on the right side, and 2+ on the left side.  Pulmonary/Chest: Effort normal and breath sounds normal. No accessory muscle usage  or stridor. No tachypnea. No respiratory distress. He has no decreased breath sounds. He has no wheezes. He has no rhonchi. He has no rales. He exhibits no tenderness, no crepitus and no edema.  Will speak in full sentences without difficulty.  Clear to auscultation bilaterally.  Oxygen saturation 100% on room air with good waveform.  Abdominal: Soft. Normal appearance and bowel sounds are normal. He exhibits no distension and no mass. There is no tenderness. There is no rigidity, no rebound and no guarding.  Musculoskeletal:       Right foot: There is normal range of motion and no deformity.       Left foot: There is normal range of motion and no deformity.  Full range of motion of the T-spine and L-spine with flexion, hyperextension, and lateral flexion. No midline tenderness or stepoffs. No tenderness to palpation of the spinous processes of the T-spine or L-spine. No tenderness to palpation of the paraspinous muscles of the L-spine. Negative straight leg raise.  Neurological: He has normal strength and normal reflexes. No sensory deficit.  Speech is clear and goal oriented, follows commands Normal 5/5 strength in upper and lower extremities bilaterally including dorsiflexion and plantar flexion, strong and equal grip strength Sensation normal to light and sharp touch Moves extremities without ataxia, coordination intact Normal gait Normal balance  No Clonus  Skin: Skin is warm and intact. No bruising, no ecchymosis, no lesion, no petechiae and no rash noted. He is not diaphoretic. No erythema.      Labs (all labs ordered are listed, but only abnormal results are displayed) Labs Reviewed  GROUP A STREP BY PCR    EKG None  Radiology Dg Chest 2 View  Result Date: 08/03/2018 CLINICAL DATA:  Sore throat congestion and headache. EXAM: CHEST - 2 VIEW COMPARISON:  09/12/2009 FINDINGS: The heart size and mediastinal contours are within normal limits. Both lungs are clear. The visualized  skeletal structures are unremarkable. IMPRESSION: Normal chest x-ray. Electronically Signed   By: Rudie Meyer M.D.   On: 08/03/2018 11:06   Dg Lumbar Spine Complete  Result Date: 08/03/2018 CLINICAL DATA:  One week history of low back pain. EXAM: LUMBAR SPINE - COMPLETE 4+ VIEW COMPARISON:  None. FINDINGS: Normal alignment of the lumbar vertebral bodies. Disc spaces and vertebral bodies are maintained. The facets are normally aligned. No pars defects. The visualized bony pelvis is intact. IMPRESSION: Normal lumbar spine series. Electronically Signed   By: Rudie Meyer M.D.   On: 08/03/2018 11:05    Procedures Procedures (including critical care time)  Medications Ordered in ED Medications  acetaminophen (TYLENOL) tablet 650 mg (650 mg Oral Given 08/03/18 1101)     Initial Impression / Assessment and Plan / ED Course  I have reviewed the triage vital signs and the nursing notes.  Pertinent labs & imaging results that were available during my care of the patient were reviewed by me and considered in my medical decision making (see chart for details).  22 year old male who appears otherwise well presents for evaluation of multiple complaints.  Has had sore throat, rhinorrhea, nasal congestion, frontal sinus pressure and productive cough x4 days.  Patient also states that he picked up something heavy and heard a "pop" in his back 5 days ago.  States his back pain has mostly resolved and rates his pain 0/10.  Denies history of IV drug use, history of malignancy, bowel or bladder incontinence, saddle paresthesia, fever.  Patient has been able to tolerate oral secretions without difficulty.  Posterior oropharynx with erythema without edema or exudate. Oxygen saturation 100% on RA with good waveform. He is in no acute distress. Admits to multiple sick contacts. Received Influenza vaccine. Will obtain strep, plain film chest and lumbar and reevaluate.   Plain film negative for infiltrates, CHF,  pulmonary edema, pneumothorax.  Lumbar film negative.  Patient's back pain likely musculoskeletal strain.  No red flags.  Low suspicion for cauda equina, lightest, discitis, transverse myelitis, fracture, disc herniation. No neurological deficits and normal neuro exam. Discussed symptomatic treatment for his musculoskeletal back pain.  Patient has upper respiratory symptoms.  These are likely viral in nature. Mildy elevated temp at 100.1. Patient states he has been afebrile at home. Strep test was negative.  Discussed symptomatic management.  Discussed follow-up with PCP for reevaluation.  Nonseptic, non-ill-appearing. Patient has tolerated p.o. intake and department of ginger ale and [place] sandwich.  No abx indicated. Presentation non concerning for PTA or RPA. No trismus or uvula deviation.  Discussed strict return precautions.  Discussed follow-up with PCP for reevaluation.  Patient is hemodynamically stable and appropriate for DC home at this time.  She voiced understanding return precautions and is agreeable for follow-up.    Final Clinical Impressions(s) / ED Diagnoses   Final diagnoses:  Viral upper respiratory tract infection  Acute bilateral  low back pain without sciatica    ED Discharge Orders         Ordered    fluticasone (FLONASE) 50 MCG/ACT nasal spray  Daily     08/03/18 1252    guaifenesin (ROBITUSSIN) 100 MG/5ML syrup  Every 4 hours PRN     08/03/18 1252           Sheronda Parran A, PA-C 08/03/18 1302    Lorre Nick, MD 08/04/18 [phone]

## 2018-10-08 ENCOUNTER — Ambulatory Visit (INDEPENDENT_AMBULATORY_CARE_PROVIDER_SITE_OTHER): Payer: Self-pay | Admitting: Pediatrics

## 2018-10-08 ENCOUNTER — Encounter (INDEPENDENT_AMBULATORY_CARE_PROVIDER_SITE_OTHER): Payer: Self-pay | Admitting: Pediatrics

## 2018-10-08 VITALS — BP 120/80 | HR 64 | Ht 70.0 in | Wt 155.8 lb

## 2018-10-08 DIAGNOSIS — G43809 Other migraine, not intractable, without status migrainosus: Secondary | ICD-10-CM | POA: Insufficient documentation

## 2018-10-08 DIAGNOSIS — Z72821 Inadequate sleep hygiene: Secondary | ICD-10-CM

## 2018-10-08 DIAGNOSIS — M25511 Pain in right shoulder: Secondary | ICD-10-CM

## 2018-10-08 DIAGNOSIS — Q8501 Neurofibromatosis, type 1: Secondary | ICD-10-CM

## 2018-10-08 NOTE — Progress Notes (Signed)
Patient: JOSH SHURTLEFF MRN: [id number] Sex: male DOB: 09-Jul-1996  Provider: Ellison Carwin, MD Location of Care: [facility] Child Neurology  Note type: Routine return visit  History of Present Illness: Referral Source: Dr. Chales Salmon History from: patient and [facility] chart Chief Complaint: Neurofibromatosis, Type 1/Learning Problems/Headaches  Leelynd L Labrake is a 23 y.o. male who was evaluated on October 08, 2018, for the first time since Jan 01, 2018.  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  His 2017 MRI scan showed minimal changes associated with neurofibromatosis and no neoplasms.  The patient injured his right shoulder on the job while working for [employer].  I do not know if this was a rotator cuff injury, but it had improved only recently to worsen.  The location where he points to his pain would be consistent with rotator cuff and the movement of elevating his arm and abducting it away from his side produces pain, which is also consistent with rotator cuff injury.  Topic of discussion today was the increased frequency of his headaches.  He states that he has two or three a week and once or twice in a week he has to call out of his job.  Typically, headaches occur in the middle of the day.  They involve the right occipital region, and come and go.  The pain can be achy, but more often than not as sharp.  Sometimes, the episodes are relatively brief lasting minutes, but they can wax and wane for hours.  He denies nausea, he occasionally has some dizziness manifested by unsteadiness.  He denies sensitivity to light and sound.  Over-the-counter medications have not helped.  He continues to have very poor sleep hygiene, something we have talked about, but he seems unwilling to practice.  In general, his health is good.  He has lost about 5 pounds  since I saw him.  Review of Systems: A complete review of systems was remarkable for patient reports that he has headaches three times a week. He states that his headaches are associated with dizziness. He also reports that his right shoulder pain has increased. No other concerns at this time, all other systems reviewed and negative.  Past Medical History Diagnosis Date  . Neurofibromatosis (HCC)    Hospitalizations: No., Head Injury: No., Nervous System Infections: No., Immunizations up to date: Yes.    MRI scan of the brain from November 03, 2008 shows minimal T2 hyperintensity inferior to the anterior genu of the left internal capsule, No enhancement associated with this lesion. 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 Mucosal thickening in the ethmoid air cells and sphenoid sinuses bilaterally.  MRI of the cervical spine August 06, 2015 showed straightening of lordosis without spinal stenosis or nerve impingement no cervical neurofibromas were identified, no enlargement of the dorsal root ganglia.  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 WISDOM TOOTH EXTRACTION     Family History family history includes Cancer in  his father. Family history is negative for migraines, seizures, intellectual disabilities, blindness, deafness, birth defects, chromosomal disorder, or autism.  Social History Social History   Socioeconomic History  . Marital status: Single  . Years of education: 39  . Highest education level:  High School Graduate  Occupational History  . Employed as a Audiological scientist at [employer]  . Financial resource strain: Not on file  . Food insecurity:    Worry: Not on file    Inability: Not on file  . Transportation needs:    Medical: Not on file    Non-medical: Not on file  Tobacco Use  . Smoking status: Former Games developer  . Smokeless tobacco: Never Used  . Tobacco comment: vaper  Substance and Sexual Activity  . Alcohol use: No    Alcohol/week: 0.0 standard drinks  . Drug use: No  . 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    He graduated from [employer].     He lives with his mother, brother, and step-dad.     He enjoys playing video games, riding his bike, watching TV, and parkour.    He is a Airline pilot at [employer]   No Known Allergies  Physical Exam BP 120/80   Pulse 64   Ht 5\' 10"  (1.778 m)   Wt 155 lb 12.8 oz (70.7 kg)   BMI 22.35 kg/m   General: alert, well developed, well nourished, in no acute distress, black hair, brown eyes, right handed Head: normocephalic, no dysmorphic features Ears, Nose and Throat: Otoscopic: tympanic membranes normal; pharynx: oropharynx is pink without exudates or tonsillar hypertrophy Neck: supple, full range of motion, no cranial or cervical bruits Respiratory: auscultation clear Cardiovascular: no murmurs, pulses are normal Musculoskeletal: no skeletal deformities or apparent scoliosis Skin: Multiple caf au lait macules of different sizes on his trunk and limbs, bilateral axillary freckles, 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, multiple soft less than 1 cm subcutaneous neurofibromas on his arms legs, right greater than left and upper and lower back; 2 of them in the distal extensor surface of the thigh are tender to palpation  Neurologic Exam  Mental Status: alert; oriented to person, place and year; knowledge is normal for  age; language is normal Cranial Nerves: visual fields are full to double simultaneous stimuli; extraocular movements are full and conjugate; pupils are round reactive to light; funduscopic examination shows sharp disc margins with normal vessels; symmetric facial strength; midline tongue and uvula; air conduction is greater than bone conduction bilaterally Motor: Normal strength, tone and mass; good fine motor movements; no pronator drift Sensory: intact responses to cold, vibration, proprioception and stereognosis Coordination: good finger-to-nose, rapid repetitive alternating movements and finger apposition Gait and Station: normal gait and station: patient is able to walk on heels, toes and tandem without difficulty; balance is adequate; Romberg exam is negative; Gower response is negative Reflexes: symmetric and diminished bilaterally; no clonus; bilateral flexor plantar responses  Assessment 1. 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 2. Pain in joint of right shoulder, M25.511. 3. Migraine variant with headache, G43.809. 4. Poor sleep hygiene, Z72.821.  Discussion Neurofibromatosis appears to be stable at this time.  Fortunately, he is not having a great deal of pain as a result of his neurofibromas and they seem to be stable.    Plan I believe the pain in his right shoulder is orthopedic and may represent a rotator cuff.  This needs the attention of an orthopedic surgeon.  I have referred him to [facility].  I think that he is having migraine variants as headaches.  Unfortunately, these were severe enough that they are causing him to miss work.  In part, his headaches and his overall health is related to his poor sleep hygiene, which he seems to be unwilling to change.  I asked him to keep a daily prospective headache calendar.  I again emphasized the need to get adequate sleep and to hydrate himself.  He does not skip meals, although he has lost some weight since I saw him last.  He will  return to see me in six months' time.  I will see him sooner based on clinical need.    Greater than 50% of the 25-minute visit was spent in counseling and coordination of care.  I asked him to send the calendars to me, so that I can determine what to do next.  If it is a migraine variant, were not likely to be successful with usual treatments for migraine.   Medication List   Accurate as of October 08, 2018 10:56 AM.    fluticasone 50 MCG/ACT nasal spray Commonly known as:  FLONASE Place 1 spray into both nostrils daily.   guaifenesin 100 MG/5ML syrup Commonly known as:  ROBITUSSIN Take 5-10 mLs (100-200 mg total) by mouth every 4 (four) hours as needed for cough.    The medication list was reviewed and reconciled. All changes or newly prescribed medications were explained.  A complete medication list was provided to the patient/caregiver.  Deetta PerlaWilliam H Jakylah Bassinger MD

## 2018-10-08 NOTE — Patient Instructions (Signed)
Please fill out the headache calendar and send it to me.  I am not certain whether we are going to be able to help you with this pain.  The sharp and brief nature of it suggest that it something we call icepick headaches which are difficult to treat.

## 2018-12-15 ENCOUNTER — Telehealth (INDEPENDENT_AMBULATORY_CARE_PROVIDER_SITE_OTHER): Payer: Self-pay | Admitting: Pediatrics

## 2018-12-15 NOTE — Telephone Encounter (Signed)
This is in the fold between the groin and the leg.  It could be intertrigo which is usually related to Candida.  I told him that he should go to an urgent care so that he can get looked at and proper treatment applied.  I told him if he did not want to do that that he can go to a pharmacy and they could suggest the antifungal cream that he might be able to place on that.  They are worried that I have this if that is not what is going on that it will get worse.  I told him that I would not be able to see him for this problem.

## 2018-12-15 NOTE — Telephone Encounter (Signed)
°  Who's calling (name and relationship to patient) : Adam Guzman, patient  Best contact number: [phone]  Provider they see: Dr. Sharene Skeans  Reason for call: Patient called because he is having problems with redness in genital area. Has been happening for a couple of weeks. Would like to speak with Dr. Sharene Skeans about what could be going on. Does not have PCP, would like Dr. Sharene Skeans to give him a call today if possible, is concerned and unsure what to do.    PRESCRIPTION REFILL ONLY  Name of prescription:  Pharmacy:

## 2018-12-17 ENCOUNTER — Ambulatory Visit (HOSPITAL_COMMUNITY)
Admission: EM | Admit: 2018-12-17 | Discharge: 2018-12-17 | Disposition: A | Discharge status: Home / Self Care | Payer: Medicaid Other | Attending: Family Medicine | Admitting: Family Medicine

## 2018-12-17 ENCOUNTER — Encounter (HOSPITAL_COMMUNITY): Payer: Self-pay

## 2018-12-17 ENCOUNTER — Other Ambulatory Visit: Payer: Self-pay

## 2018-12-17 DIAGNOSIS — Z7251 High risk heterosexual behavior: Secondary | ICD-10-CM

## 2018-12-17 DIAGNOSIS — Z711 Person with feared health complaint in whom no diagnosis is made: Secondary | ICD-10-CM | POA: Diagnosis present

## 2018-12-17 DIAGNOSIS — N485 Ulcer of penis: Secondary | ICD-10-CM | POA: Diagnosis present

## 2018-12-17 NOTE — ED Provider Notes (Signed)
[facility]   [id number] 12/17/18 Arrival Time: 1654   CC: Penile rash  SUBJECTIVE:  Adam Guzman is a 23 y.o. male hx significant for neurofibromatosis, who presents with rash under the shaft of his penis x 1 month.  Denies a precipitating event, trauma, or changes in soaps, detergents, bath products, or condoms.  Denies unprotected sex.  Partners asymptomatic.  Last unprotected sexual encounter prior to symptoms.  Has been sexually active with 3 male partners over the past 6 months.  Denies similar symptoms in the past.  Denies fever, chills, nausea, vomiting, abdominal or pelvic pain, testicular swelling or pain, urethral discharge, dysuria.     ROS: As per HPI.  Past Medical History:  Diagnosis Date  . Neurofibromatosis [facility](HCC)    Past Surgical History:  Procedure Laterality Date  . WISDOM TOOTH EXTRACTION     No Known Allergies No current facility-administered medications on file prior to encounter.    No current outpatient medications on file prior to encounter.   Social History   Socioeconomic History  . Marital status: Single    Spouse name: Not on file  . Number of children: Not on file  . Years of education: Not on file  . Highest education level: Not on file  Occupational History  . Not on file  Social Needs  . Financial resource strain: Not on file  . Food insecurity:    Worry: Not on file    Inability: Not on file  . Transportation needs:    Medical: Not on file    Non-medical: Not on file  Tobacco Use  . Smoking status: Former Games developermoker  . Smokeless tobacco: Never Used  . Tobacco comment: vapor  Substance and Sexual Activity  . Alcohol use: No    Alcohol/week: 0.0 standard drinks  . Drug use: No  . Sexual activity: Yes  Lifestyle  . Physical activity:    Days per week: Not on file    Minutes per session: Not on file  . Stress: Not on file  Relationships  . Social connections:    Talks on phone: Not on file    Gets together: Not on  file    Attends religious service: Not on file    Active member of club or organization: Not on file    Attends meetings of clubs or organizations: Not on file    Relationship status: Not on file  . Intimate partner violence:    Fear of current or ex partner: Not on file    Emotionally abused: Not on file    Physically abused: Not on file    Forced sexual activity: Not on file  Other Topics Concern  . Not on file  Social History Narrative   Cherre Blancyric is a high Garment/textile technologists[employer] graduate.   He graduated from [employer].    He lives with his mother, brother, and step-dad.    He enjoys playing video games, riding his bike, watching TV, and parkour.   He is a Airline pilotwaiter at [employer]   Family History  Problem Relation Age of Onset  . Cancer Father        Died at 633    OBJECTIVE:  Vitals:   12/17/18 1720  BP: (!) 148/50  Pulse: 76  Resp: 17  Temp: 98.9 F (37.2 C)  TempSrc: Oral  SpO2: 99%     General appearance: alert, NAD, appears stated age Head: NCAT Throat: lips, mucosa, and tongue normal; teeth and gums normal  Lungs: CTA bilaterally without adventitious breath sounds Heart: regular rate and rhythm.  Radial pulses 2+ symmetrical bilaterally Back: no CVA tenderness Abdomen: soft, non-tender; bowel sounds normal; no guarding GU: Chaperone Neurosurgeon present.  Circumcised male; + bilateral inguinal LAD; healing shallow ulcer in 7 o'clock position of the distal shaft of the penis, no erythema or drainage, NTTP; no penile discharge; no obvious swelling; testicles appear symmetrical in size Skin: warm and dry Psychological:  Alert and cooperative. Normal mood and affect.  LABS:  Results for orders placed or performed during the hospital encounter of 08/03/18  Group A Strep by PCR  Result Value Ref Range   Group A Strep by PCR NOT DETECTED NOT DETECTED    Labs Reviewed  HIV ANTIBODY (ROUTINE TESTING W REFLEX)  RPR  URINE CYTOLOGY ANCILLARY ONLY    ASSESSMENT & PLAN:   1. Concern about STD in male without diagnosis   2. Penile ulcer     Pending: Labs Reviewed  HIV ANTIBODY (ROUTINE TESTING W REFLEX)  RPR  URINE CYTOLOGY ANCILLARY ONLY    Declines treatment today.  Would like to wait on the results Urine cytology obtained  HIV/ syphilis testing today We will follow up with you regarding the results of your test If tests are positive, please abstain from sexual activity for at least 7 days and notify partners Follow up with PCP if symptoms persists Return here or go to ER if you have any new or worsening symptoms such as fever, chills, nausea, vomiting, abdominal or pelvic pain, testicular swelling or pain, urethral discharge, burning with urination, etc...    Reviewed expectations re: course of current medical issues. Questions answered. Outlined signs and symptoms indicating need for more acute intervention. Patient verbalized understanding. After Visit Summary given.       Rennis Harding, PA-C 12/17/18 1933

## 2018-12-17 NOTE — ED Triage Notes (Signed)
Patient presents to Urgent Care with complaints of penile irritation under the shaft of his penis since about a month ago. Patient states he has tried a cream on it and it has not helped. Pt denies unprotected sex.

## 2018-12-17 NOTE — Discharge Instructions (Signed)
Declines treatment today.  Would like to wait on the results Urine cytology obtained  HIV/ syphilis testing today We will follow up with you regarding the results of your test If tests are positive, please abstain from sexual activity for at least 7 days and notify partners Follow up with PCP if symptoms persists Return here or go to ER if you have any new or worsening symptoms such as fever, chills, nausea, vomiting, abdominal or pelvic pain, testicular swelling or pain, urethral discharge, burning with urination, etc..Marland Kitchen

## 2018-12-18 LAB — URINE CYTOLOGY ANCILLARY ONLY
Chlamydia: NEGATIVE
Neisseria Gonorrhea: NEGATIVE
Trichomonas: NEGATIVE

## 2018-12-18 LAB — HIV ANTIBODY (ROUTINE TESTING W REFLEX): HIV Screen 4th Generation wRfx: NONREACTIVE

## 2018-12-18 LAB — RPR: RPR Ser Ql: NONREACTIVE

## 2018-12-19 ENCOUNTER — Telehealth (HOSPITAL_COMMUNITY): Payer: Self-pay | Admitting: Emergency Medicine

## 2018-12-19 NOTE — Telephone Encounter (Signed)
Patient called for results, provided.  No further action required at this time.

## 2018-12-20 ENCOUNTER — Telehealth (HOSPITAL_COMMUNITY): Payer: Self-pay | Admitting: Emergency Medicine

## 2018-12-20 NOTE — Telephone Encounter (Signed)
Spoke to patient and reassured him his test results were negative.  States rash is improving.  Rash most likely related to irritation.  Directed him to avoid harsh soaps and friction.  To moisturize daily.  He will discontinue antifungal cream, and will try to incorporating OTC neosporin as needed.  Will follow up if symptoms persists and/or worsen.

## 2018-12-20 NOTE — Telephone Encounter (Signed)
Patient called asking for something for his rash.

## 2019-01-08 ENCOUNTER — Other Ambulatory Visit: Payer: Self-pay

## 2019-01-08 ENCOUNTER — Encounter (HOSPITAL_COMMUNITY): Payer: Self-pay | Admitting: Emergency Medicine

## 2019-01-08 ENCOUNTER — Ambulatory Visit (HOSPITAL_COMMUNITY)
Admission: EM | Admit: 2019-01-08 | Discharge: 2019-01-08 | Disposition: A | Discharge status: Home / Self Care | Payer: Medicaid Other | Attending: Internal Medicine | Admitting: Internal Medicine

## 2019-01-08 DIAGNOSIS — N481 Balanitis: Secondary | ICD-10-CM

## 2019-01-08 MED ORDER — CLOTRIMAZOLE-BETAMETHASONE 1-0.05 % EX CREA: TOPICAL_CREAM | Freq: Two times a day (BID) | CUTANEOUS | 0 refills | Status: AC

## 2019-01-08 NOTE — ED Provider Notes (Signed)
[facility]    CSN: [id number] Arrival date & time: 01/08/19  1546     History   Chief Complaint Chief Complaint  Patient presents with  . SEXUALLY TRANSMITTED DISEASE    HPI Adam Guzman is a 23 y.o. male a history of neurofibromata comes to urgent care with complaints of painless lesions on the penis.  Patient was recently evaluated for STD and his work-up including RPR and HIV were negative.  Patient was not given any medication at the time.  He continues to have some itching on the penis.  The lesions are persistent.  He denies any dysuria urgency or frequency.  No known aggravating factors.  He has not tried any over-the-counter medications.  Patient has not been sexually active for several weeks.  No groin pain or swelling.  No urethral discharge.  HPI  Past Medical History:  Diagnosis Date  . Neurofibromatosis [facility](HCC)     Patient Active Problem List   Diagnosis Date Noted  . Migraine variant with headache 10/08/2018  . Poor sleep hygiene 01/02/2018  . Pain in joint, shoulder region 03/20/2017  . Migraine without aura and without status migrainosus, not intractable 01/11/2016  . Neck pain on right side 07/13/2015  . Delayed milestones 12/10/2013  . 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 Problems with learning 12/10/2013  . GYNECOMASTIA, UNILATERAL 09/02/2007  . ECZEMA, ATOPIC DERMATITIS 10/24/2006    Past Surgical History:  Procedure Laterality Date  . WISDOM TOOTH EXTRACTION         Home Medications    Prior to Admission medications   Medication Sig Start Date End Date Taking? Authorizing Provider  clotrimazole-betamethasone (LOTRISONE) cream Apply topically 2 (two) times daily for 7 days. Apply to affected area 2 times daily 01/08/19 01/15/19  Lamptey, Britta MccreedyPhilip O, MD    Family History Family History  Problem Relation Age of Onset  . Cancer Father        Died at 6333    Social History Social History    Tobacco Use  . Smoking status: Former Games developermoker  . Smokeless tobacco: Never Used  . Tobacco comment: vapor  Substance Use Topics  . Alcohol use: No    Alcohol/week: 0.0 standard drinks  . Drug use: No     Allergies   Patient has no known allergies.   Review of Systems Review of Systems  Constitutional: Negative for activity change and appetite change.  HENT: Negative.   Respiratory: Negative.   Cardiovascular: Negative.   Gastrointestinal: Negative.   Endocrine: Negative.   Genitourinary: Positive for genital sores. Negative for discharge, dysuria, penile pain, penile swelling, scrotal swelling, testicular pain and urgency.  Musculoskeletal: Negative.   Neurological: Negative.   Psychiatric/Behavioral: Negative.      Physical Exam Triage Vital Signs ED Triage Vitals  Enc Vitals Group     BP 01/08/19 1637 131/61     Pulse Rate 01/08/19 1637 74     Resp 01/08/19 1637 16     Temp 01/08/19 1637 98.3 F (36.8 C)     Temp Source 01/08/19 1637 Oral     SpO2 01/08/19 1637 100 %     Weight --      Height --      Head Circumference --      Peak Flow --      Pain Score 01/08/19 1636 0     Pain Loc --      Pain Edu? --  Excl. in [place]? --    No data found.  Updated Vital Signs BP 131/61 (BP Location: Left Arm)   Pulse 74   Temp 98.3 F (36.8 C) (Oral)   Resp 16   SpO2 100%   Visual Acuity Right Eye Distance:   Left Eye Distance:   Bilateral Distance:    Right Eye Near:   Left Eye Near:    Bilateral Near:     Physical Exam Constitutional:      General: He is not in acute distress.    Appearance: Normal appearance. He is not ill-appearing.  Cardiovascular:     Rate and Rhythm: Normal rate and regular rhythm.  Pulmonary:     Effort: Pulmonary effort is normal.     Breath sounds: Normal breath sounds.  Abdominal:     General: Bowel sounds are normal.     Palpations: Abdomen is soft.  Genitourinary:    Comments: Shallow ulcerations on the head of the  penis and the distal aspect of the penile shaft.  No groin tenderness.  No erythema Neurological:     Mental Status: He is alert.      UC Treatments / Results  Labs (all labs ordered are listed, but only abnormal results are displayed) Labs Reviewed - No data to display  EKG None  Radiology No results found.  Procedures Procedures (including critical care time)  Medications Ordered in UC Medications - No data to display  Initial Impression / Assessment and Plan / UC Course  I have reviewed the triage vital signs and the nursing notes.  Pertinent labs & imaging results that were available during my care of the patient were reviewed by me and considered in my medical decision making (see chart for details).     1.  Balanitis: Clotrimazole-beclomethasone cream to apply twice daily for 7 days Personal hygiene emphasized Recent STD work-up was negative Patient is advised to come back to urgent care if there is no improvement in symptoms If patient's symptoms are persistent, urology referral will be appropriate. Final Clinical Impressions(s) / UC Diagnoses   Final diagnoses:  Balanitis circinata   Discharge Instructions   None    ED Prescriptions    Medication Sig Dispense Auth. Provider   clotrimazole-betamethasone (LOTRISONE) cream Apply topically 2 (two) times daily for 7 days. Apply to affected area 2 times daily 15 g Lamptey, Britta Mccreedy, MD     Controlled Substance Prescriptions [place] Controlled Substance Registry consulted? No   Merrilee Jansky, MD 01/08/19 [name]

## 2019-01-08 NOTE — ED Triage Notes (Signed)
Continues with rash in groin area.  Reports itching, burning and he thinks its spreading

## 2019-04-27 ENCOUNTER — Ambulatory Visit (INDEPENDENT_AMBULATORY_CARE_PROVIDER_SITE_OTHER): Payer: Medicaid Other | Admitting: Pediatrics

## 2019-05-01 ENCOUNTER — Other Ambulatory Visit: Payer: Self-pay

## 2019-05-01 ENCOUNTER — Ambulatory Visit (HOSPITAL_COMMUNITY)
Admission: EM | Admit: 2019-05-01 | Discharge: 2019-05-01 | Disposition: A | Discharge status: Home / Self Care | Payer: HRSA Program | Attending: Family Medicine | Admitting: Family Medicine

## 2019-05-01 ENCOUNTER — Encounter (HOSPITAL_COMMUNITY): Payer: Self-pay

## 2019-05-01 DIAGNOSIS — J069 Acute upper respiratory infection, unspecified: Secondary | ICD-10-CM | POA: Insufficient documentation

## 2019-05-01 DIAGNOSIS — Z720 Tobacco use: Secondary | ICD-10-CM

## 2019-05-01 DIAGNOSIS — Z20828 Contact with and (suspected) exposure to other viral communicable diseases: Secondary | ICD-10-CM | POA: Insufficient documentation

## 2019-05-01 DIAGNOSIS — R05 Cough: Secondary | ICD-10-CM

## 2019-05-01 MED ORDER — CETIRIZINE HCL 10 MG PO TABS: 10.0000 mg | ORAL_TABLET | Freq: Every day | ORAL | 0 refills | Status: DC

## 2019-05-01 NOTE — ED Provider Notes (Signed)
[place]    CSN: [id number] Arrival date & time: 05/01/19  1715      History   Chief Complaint Chief Complaint  Patient presents with  . Cough    HPI Adam Guzman is a 23 y.o. male.   Adam Guzman presents with complaints of cough. Overall feels well. Works at [employer] however, so presented to get covid testing. Started last week with nasal drainage. Nasal drainage stopped. Cough started last week as well. No fevers. No sore throat. No gi symptoms. No loss of taste or smell. Hasn't taken any medications for symptoms. No known ill contacts. No shortness of breath . No chest pain . Smoke cigarettes, 2-3 a day. No asthma history. History  Of neurodermatosis.     ROS per HPI, negative if not otherwise mentioned.      Past Medical History:  Diagnosis Date  . Neurofibromatosis [facility])     Patient Active Problem List   Diagnosis Date Noted  . Migraine variant with headache 10/08/2018  . Poor sleep hygiene 01/02/2018  . Pain in joint, shoulder region 03/20/2017  . Migraine without aura and without status migrainosus, not intractable 01/11/2016  . Neck pain on right side 07/13/2015  . Delayed milestones 12/10/2013  . Neurofibromatosis, type 1 (von Recklinghausen's disease) ([place]) 12/10/2013  . Problems with learning 12/10/2013  . GYNECOMASTIA, UNILATERAL 09/02/2007  . ECZEMA, ATOPIC DERMATITIS 10/24/2006    Past Surgical History:  Procedure Laterality Date  . WISDOM TOOTH EXTRACTION         Home Medications    Prior to Admission medications   Medication Sig Start Date End Date Taking? Authorizing Provider  cetirizine (ZYRTEC) 10 MG tablet Take 1 tablet (10 mg total) by mouth daily. 05/01/19   Zigmund Gottron, NP    Family History Family History  Problem Relation Age of Onset  . Cancer Father        Died at 64  . Healthy Mother     Social History Social History   Tobacco Use  . Smoking status: Current Every Day Smoker    Packs/day:  0.20    Types: Cigarettes  . Smokeless tobacco: Never Used  Substance Use Topics  . Alcohol use: Yes    Alcohol/week: 0.0 standard drinks    Comment: socially  . Drug use: No     Allergies   Patient has no known allergies.   Review of Systems Review of Systems   Physical Exam Triage Vital Signs ED Triage Vitals  Enc Vitals Group     BP 05/01/19 1754 126/68     Pulse Rate 05/01/19 1754 85     Resp 05/01/19 1754 15     Temp 05/01/19 1754 99.8 F (37.7 C)     Temp Source 05/01/19 1754 Temporal     SpO2 05/01/19 1754 100 %     Weight --      Height --      Head Circumference --      Peak Flow --      Pain Score 05/01/19 1752 0     Pain Loc --      Pain Edu? --      Excl. in [place]? --    No data found.  Updated Vital Signs BP 126/68 (BP Location: Right Arm)   Pulse 85   Temp 99.8 F (37.7 C) (Temporal)   Resp 15   SpO2 100%   Visual Acuity Right Eye Distance:   Left Eye  Distance:   Bilateral Distance:    Right Eye Near:   Left Eye Near:    Bilateral Near:     Physical Exam Constitutional:      Appearance: He is well-developed.  HENT:     Nose: Nose normal.  Eyes:     Conjunctiva/sclera: Conjunctivae normal.  Cardiovascular:     Rate and Rhythm: Normal rate and regular rhythm.  Pulmonary:     Effort: Pulmonary effort is normal.     Breath sounds: Normal breath sounds.     Comments: Rare congested cough noted  Skin:    General: Skin is warm and dry.  Neurological:     Mental Status: He is alert and oriented to person, place, and time.      UC Treatments / Results  Labs (all labs ordered are listed, but only abnormal results are displayed) Labs Reviewed  NOVEL CORONAVIRUS, NAA (HOSP ORDER, SEND-OUT TO REF LAB; TAT 18-24 HRS)    EKG   Radiology No results found.  Procedures Procedures (including critical care time)  Medications Ordered in UC Medications - No data to display  Initial Impression / Assessment and Plan / UC Course  I  have reviewed the triage vital signs and the nursing notes.  Pertinent labs & imaging results that were available during my care of the patient were reviewed by me and considered in my medical decision making (see chart for details).     Non toxic. Benign physical exam.  Afebrile. No hypoxia or tachycardia. Patient endorses history of allergies with similar in the past. Encouraged supportive cares. covid testing pending. Will notify of any positive findings and if any changes to treatment are needed.  Return precautions provided. Patient verbalized understanding and agreeable to plan.   Final Clinical Impressions(s) / UC Diagnoses   Final diagnoses:  Upper respiratory tract infection, unspecified type     Discharge Instructions     Push fluids to ensure adequate hydration and keep secretions thin.  Tylenol and/or ibuprofen as needed for pain or fevers.  Daily zyrtec or over the counter cough syrup as needed.  Self isolate until results are back and negative.  Will notify you of any positive findings. You may monitor your results on your MyChart online as well.    Please return for any worsening of symptoms or even go to the er.     ED Prescriptions    Medication Sig Dispense Auth. Provider   cetirizine (ZYRTEC) 10 MG tablet Take 1 tablet (10 mg total) by mouth daily. 30 tablet Georgetta HaberBurky, Marrie Chandra B, NP     Controlled Substance Prescriptions  Controlled Substance Registry consulted? Not Applicable   Georgetta HaberBurky, Marni Franzoni B, NP 05/02/19 [phone]

## 2019-05-01 NOTE — ED Triage Notes (Signed)
Patient presents to Urgent Care with complaints of slight cough since last week. Patient reports he has seasonal allergies and thinks that may be the cause.  Pt would like COVID testing today.

## 2019-05-01 NOTE — Discharge Instructions (Signed)
Push fluids to ensure adequate hydration and keep secretions thin.  Tylenol and/or ibuprofen as needed for pain or fevers.  Daily zyrtec or over the counter cough syrup as needed.  Self isolate until results are back and negative.  Will notify you of any positive findings. You may monitor your results on your MyChart online as well.    Please return for any worsening of symptoms or even go to the er.

## 2019-05-02 LAB — NOVEL CORONAVIRUS, NAA (HOSP ORDER, SEND-OUT TO REF LAB; TAT 18-24 HRS): SARS-CoV-2, NAA: NOT DETECTED

## 2019-05-07 ENCOUNTER — Ambulatory Visit (INDEPENDENT_AMBULATORY_CARE_PROVIDER_SITE_OTHER): Payer: Medicaid Other | Admitting: Pediatrics

## 2019-05-20 ENCOUNTER — Encounter (INDEPENDENT_AMBULATORY_CARE_PROVIDER_SITE_OTHER): Payer: Self-pay | Admitting: Pediatrics

## 2019-05-20 ENCOUNTER — Other Ambulatory Visit: Payer: Self-pay

## 2019-05-20 ENCOUNTER — Ambulatory Visit (INDEPENDENT_AMBULATORY_CARE_PROVIDER_SITE_OTHER): Payer: Self-pay | Admitting: Pediatrics

## 2019-05-20 VITALS — BP 120/90 | HR 76 | Ht 69.75 in | Wt 149.8 lb

## 2019-05-20 DIAGNOSIS — Q8501 Neurofibromatosis, type 1: Secondary | ICD-10-CM

## 2019-05-20 NOTE — Progress Notes (Signed)
Patient: Adam Guzman MRN: [id number] Sex: male DOB: [birth year]  Provider: Ellison Carwin, MD Location of Care: [facility]  Note type: Routine return visit  History of Present Illness: Referral Source: Adam Salmon, MD History from: patient and [facility] chart Chief Complaint: Neurofibromatosis, Type 1/Learning problems/Headaches  Adam Guzman is a 23 y.o. 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 He has occasional tension type headaches 2x a month, that respond well to tylenol. He has no pain or new nodules. He has 1 furuncle on the left bicep this is unrelated to his NF1. He has no concerns.  His general health is good.  He sleeps well and has normal appetite.  He has lost about 6 pounds in the past 6 months.  Review of Systems: A complete review of systems was remarkable for patient reports that he has some concerns today. he reports that he has a bump that has appeared on his left arm. He does not know where it comes from. He also reports that he has been itching on his back and arms. He states that he is not sure where the itching is coming from. He states that he has headaches every two weeks and it is like one or two. No other concerns at this time., all other systems reviewed and negative.  Past Medical History Diagnosis Date  . Neurofibromatosis (HCC)    Hospitalizations: No., Head Injury: No., Nervous System Infections: No., Immunizations up to date: Yes.    Copied from prior chart MRI scan of the brain from November 03, 2008 shows minimal T2 hyperintensity inferior to the anterior genu of the left internal capsule, No enhancement associated with this lesion. 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 Mucosal thickening in the ethmoid air cells and sphenoid sinuses bilaterally.  MRI of the cervical spine August 06, 2015 showed straightening of lordosis without spinal stenosis or nerve  impingement no cervical neurofibromas were identified, no enlargement of the dorsal root ganglia.  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  Birth History Full-term infant, birth weight unknown Labor was unremarkable Normal spontaneous vaginal delivery Nursery course was unremarkable Early development was normal but the patient was noted to have intellectual disabilities in elementary school  Surgical History Procedure Laterality Date  . WISDOM TOOTH EXTRACTION     Family History family history includes Cancer in his father; Healthy in his mother. Family history is negative for migraines, seizures, intellectual disabilities, blindness, deafness, birth defects, chromosomal disorder, or autism.  Social History Socioeconomic History  . Marital status: Single  . Years of education:  59  . Highest education level:  High school graduate  Occupational History  .  Employed full-time as a Production assistant, radio at [employer]  . Financial resource strain: Not on file  . Food insecurity    Worry: Not on file    Inability: Not on file  . Transportation needs    Medical: Not on file    Non-medical: Not on file  Tobacco Use  . Smoking status: Current Every Day Smoker    Packs/day: 0.20    Types: Cigarettes  . Smokeless tobacco: Never Used  Substance and Sexual Activity  . Alcohol use: Yes    Alcohol/week: 0.0 standard drinks    Comment: socially  . Drug use: No  . 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    He graduated from [employer].     He lives with his mother, brother, and step-dad.     He enjoys playing video games, riding his bike, watching TV, and parkour.    He is a Doctor, general practice at [employer]   No Known Allergies  Physical Exam BP 120/90   Pulse 76   Ht 5' 9.75" (1.772 m)   Wt 149 lb 12.8 oz (67.9 kg)   BMI 21.65  kg/m   Lifestyle  Physical activity  . Days per week: Not on file  . Minutes per session: Not on file   Physical Exam  Constitutional: He is oriented to person, place, and time. He appears well-developed and well-nourished.  HENT:  Head: Normocephalic and atraumatic.  Eyes: Pupils are equal, round, and reactive to light. Conjunctivae and EOM are normal.  Neck: Normal range of motion. Neck supple.  Cardiovascular: Normal rate and regular rhythm.  Respiratory: Effort normal and breath sounds normal.  GI: Soft. Bowel sounds are normal.  Musculoskeletal: Normal range of motion.  Neurological: He is alert and oriented to person, place, and time. He has normal reflexes. He displays normal reflexes. No cranial nerve deficit. He exhibits normal muscle tone.  Skin: Skin is warm.  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 His behavior is normal. Judgment normal.   Assessment 1.  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 He has occasional tension type headaches 2x a month, that respond well to tylenol. He has no pain or new nodules. He voiced no concerns today. We will continue close followup for any changes to his symptoms.   Plan 1. Follow up in 1 year.   Medication List   Accurate as of May 20, 2019  4:05 PM. If you have any questions, ask your nurse or doctor.    cetirizine 10 MG tablet Commonly known as: ZYRTEC Take 1 tablet (10 mg total) by mouth daily.    The medication list was reviewed and reconciled. All  changes or newly prescribed medications were explained.  A complete medication list was provided to the patient/caregiver.  Adam Roche, MD [facility] PGY1 Peds Teaching Service  I supervised Dr.Sender and agree with his comments except as amended.  Greater than 50% of a 15-minute visit was spent in counseling and coordination of care concerning his Neurofibromatosis-type I.   I performed physical examination, participated in history taking, and guided decision making.  Jodi Geralds MD

## 2019-05-20 NOTE — Patient Instructions (Signed)
Good to see you.  See you in a year.

## 2019-09-17 ENCOUNTER — Ambulatory Visit (HOSPITAL_COMMUNITY)
Admission: EM | Admit: 2019-09-17 | Discharge: 2019-09-17 | Disposition: A | Discharge status: Home / Self Care | Payer: Medicaid Other | Attending: Urgent Care | Admitting: Urgent Care

## 2019-09-17 ENCOUNTER — Encounter (HOSPITAL_COMMUNITY): Payer: Self-pay | Admitting: Emergency Medicine

## 2019-09-17 ENCOUNTER — Other Ambulatory Visit: Payer: Self-pay

## 2019-09-17 DIAGNOSIS — L02224 Furuncle of groin: Secondary | ICD-10-CM

## 2019-09-17 DIAGNOSIS — L089 Local infection of the skin and subcutaneous tissue, unspecified: Secondary | ICD-10-CM

## 2019-09-17 DIAGNOSIS — R1032 Left lower quadrant pain: Secondary | ICD-10-CM

## 2019-09-17 MED ORDER — NAPROXEN 500 MG PO TABS: 500.0000 mg | ORAL_TABLET | Freq: Two times a day (BID) | ORAL | 0 refills | Status: DC

## 2019-09-17 MED ORDER — CEPHALEXIN 500 MG PO CAPS: 500.0000 mg | ORAL_CAPSULE | Freq: Three times a day (TID) | ORAL | 0 refills | Status: DC

## 2019-09-17 NOTE — ED Triage Notes (Signed)
Pt reports an abscess to his right groin area that he has had for several days.  He states it did drain a day or two ago.

## 2019-09-17 NOTE — ED Provider Notes (Signed)
  [facility]   MRN: [id number] DOB: [birth year]  Subjective:   Adam Guzman is a 24 y.o. male presenting for 1 week history of persistent boil over his right groin bordering the testicle.  Patient states the area came to ahead and he popped it.  He has had persistent draining since then.  Still feels pain only when he expresses it.  Has been applying an ointment to it.  No current facility-administered medications for this encounter.  Current Outpatient Medications:  .  cetirizine (ZYRTEC) 10 MG tablet, Take 1 tablet (10 mg total) by mouth daily. (Patient not taking: Reported on 05/20/2019), Disp: 30 tablet, Rfl: 0   No Known Allergies  Past Medical History:  Diagnosis Date  . Neurofibromatosis [facility])      Past Surgical History:  Procedure Laterality Date  . WISDOM TOOTH EXTRACTION      Family History  Problem Relation Age of Onset  . Cancer Father        Died at 5  . Healthy Mother     Social History   Tobacco Use  . Smoking status: Current Every Day Smoker    Packs/day: 0.20    Types: Cigarettes  . Smokeless tobacco: Never Used  Substance Use Topics  . Alcohol use: Yes    Alcohol/week: 0.0 standard drinks    Comment: socially  . Drug use: No    ROS   Objective:   Vitals: BP 133/74 (BP Location: Right Arm)   Pulse 89   Temp 98.4 F (36.9 C) (Oral)   Resp 14   SpO2 100%   Physical Exam Constitutional:      General: He is not in acute distress.    Appearance: Normal appearance. He is well-developed and normal weight. He is not ill-appearing, toxic-appearing or diaphoretic.  HENT:     Head: Normocephalic and atraumatic.     Right Ear: External ear normal.     Left Ear: External ear normal.     Nose: Nose normal.     Mouth/Throat:     Pharynx: Oropharynx is clear.  Eyes:     General: No scleral icterus.       Right eye: No discharge.        Left eye: No discharge.     Extraocular Movements: Extraocular movements intact.     Pupils:  Pupils are equal, round, and reactive to light.  Cardiovascular:     Rate and Rhythm: Normal rate.  Pulmonary:     Effort: Pulmonary effort is normal.  Genitourinary:   Musculoskeletal:     Cervical back: Normal range of motion.  Neurological:     Mental Status: He is alert and oriented to person, place, and time.  Psychiatric:        Mood and Affect: Mood normal.        Behavior: Behavior normal.        Thought Content: Thought content normal.        Judgment: Judgment normal.     Assessment and Plan :   1. Skin infection   2. Groin pain, left     Patient is to start Keflex. Wound care reviewed. Counseled patient on potential for adverse effects with medications prescribed/recommended today, ER and return-to-clinic precautions discussed, patient verbalized understanding.    Wallis Bamberg, PA-C 09/17/19 2010

## 2019-09-17 NOTE — Discharge Instructions (Addendum)
Please change your dressing 2-3 times daily. Do not apply any ointments or creams. Each time you change your dressing, make sure you clean gently around the perimeter of the wound with gentle soap and warm water.

## 2020-01-17 ENCOUNTER — Encounter (HOSPITAL_COMMUNITY): Payer: Self-pay

## 2020-01-17 ENCOUNTER — Emergency Department (HOSPITAL_COMMUNITY)
Admission: EM | Admit: 2020-01-17 | Discharge: 2020-01-17 | Disposition: A | Discharge status: Home / Self Care | Payer: Medicaid Other | Attending: Emergency Medicine | Admitting: Emergency Medicine

## 2020-01-17 DIAGNOSIS — J029 Acute pharyngitis, unspecified: Secondary | ICD-10-CM | POA: Insufficient documentation

## 2020-01-17 DIAGNOSIS — F17210 Nicotine dependence, cigarettes, uncomplicated: Secondary | ICD-10-CM | POA: Insufficient documentation

## 2020-01-17 NOTE — ED Triage Notes (Signed)
Pt presents with c/o sore throat that started last night. Pt reports difficulty swallowing, denies any fever.

## 2020-01-17 NOTE — Discharge Instructions (Addendum)
Please try over the counter medication, such as cough syrup, hot tea, or cough drops for symptomatic treatment.  If you experience any fever, shortness of breath or difficulty swallowing return to the ED.

## 2020-01-17 NOTE — ED Provider Notes (Addendum)
[facility] DEPT Provider Note   CSN: [id number] Arrival date & time: 01/17/20  1418     History Chief Complaint  Patient presents with  . Sore Throat    Adam Guzman is a 24 y.o. male.  24 y.o male with a PMH of Neurofibromatosis presents to the ED with a chief complaint of sore throat x last night. Patient describes this as pain to the oropharynx, reports this is worse with swallowing along with fluid intake. He has not tried any medication for improvement in his symptoms. He has had similar episodes in the past, reports "it was usually sore throat", vague responses. He is tolerating his secretions. No fever, cough, shortness of breath or chest pain.   The history is provided by the patient.  Sore Throat       Past Medical History:  Diagnosis Date  . Neurofibromatosis [facility])     Patient Active Problem List   Diagnosis Date Noted  . Migraine variant with headache 10/08/2018  . Poor sleep hygiene 01/02/2018  . Pain in joint, shoulder region 03/20/2017  . Migraine without aura and without status migrainosus, not intractable 01/11/2016  . Neck pain on right side 07/13/2015  . Delayed milestones 12/10/2013  . Neurofibromatosis, type 1 (von Recklinghausen's disease) ([place]) 12/10/2013  . Problems with learning 12/10/2013  . GYNECOMASTIA, UNILATERAL 09/02/2007  . ECZEMA, ATOPIC DERMATITIS 10/24/2006    Past Surgical History:  Procedure Laterality Date  . WISDOM TOOTH EXTRACTION         Family History  Problem Relation Age of Onset  . Cancer Father        Died at 41  . Healthy Mother     Social History   Tobacco Use  . Smoking status: Current Every Day Smoker    Packs/day: 0.20    Types: Cigarettes  . Smokeless tobacco: Never Used  Substance Use Topics  . Alcohol use: Yes    Alcohol/week: 0.0 standard drinks    Comment: socially  . Drug use: No    Home Medications Prior to Admission medications   Medication Sig Start  Date End Date Taking? Authorizing Provider  cephALEXin (KEFLEX) 500 MG capsule Take 1 capsule (500 mg total) by mouth 3 (three) times daily. 09/17/19   Jaynee Eagles, PA-C  cetirizine (ZYRTEC) 10 MG tablet Take 1 tablet (10 mg total) by mouth daily. Patient not taking: Reported on 05/20/2019 05/01/19   Augusto Gamble B, NP  naproxen (NAPROSYN) 500 MG tablet Take 1 tablet (500 mg total) by mouth 2 (two) times daily. 09/17/19   Jaynee Eagles, PA-C    Allergies    Patient has no known allergies.  Review of Systems   Review of Systems  Constitutional: Negative for fever.  HENT: Positive for sore throat. Negative for postnasal drip, sinus pressure and sinus pain.     Physical Exam Updated Vital Signs BP 130/72 (BP Location: Left Arm)   Pulse 83   Temp 98.1 F (36.7 C) (Oral)   Resp 18   SpO2 100%   Physical Exam Vitals and nursing note reviewed.  Constitutional:      Appearance: He is well-developed. He is not ill-appearing or toxic-appearing.  HENT:     Head: Normocephalic and atraumatic.     Mouth/Throat:     Pharynx: Oropharynx is clear. Uvula midline. No oropharyngeal exudate.     Tonsils: No tonsillar exudate or tonsillar abscesses.     Comments: No tonsillar exudates, no PTA, oropharynx slightly erythematous  with uvula midline.  Cardiovascular:     Rate and Rhythm: Normal rate.  Pulmonary:     Effort: Pulmonary effort is normal.     Breath sounds: Normal breath sounds. No wheezing or rales.  Abdominal:     Palpations: Abdomen is soft.  Skin:    General: Skin is warm and dry.  Neurological:     Mental Status: He is alert.     ED Results / Procedures / Treatments   Labs (all labs ordered are listed, but only abnormal results are displayed) Labs Reviewed - No data to display  EKG None  Radiology No results found.  Procedures Procedures (including critical care time)  Medications Ordered in ED Medications - No data to display  ED Course  I have reviewed the  triage vital signs and the nursing notes.  Pertinent labs & imaging results that were available during my care of the patient were reviewed by me and considered in my medical decision making (see chart for details).    MDM Rules/Calculators/A&P   Patient with no pertinent past medical history presents to the ED with a chief complaint of sore throat since last night, has not tried a medication for improvement in symptoms.  Oropharynx appears clear with mild erythema without any tonsillar exudates, PTA.  Patient is very well-appearing, afebrile, vitals are otherwise within normal limits.  No difficulty swallowing, no stridor, appears in no distress.  Will try over-the-counter medication for improvement in symptoms.  No tonsillar exudates, Centor criteria 1, lower suspicion for strep pharyngitis.  Upon discharge home patient requests a work note to return back, when asked if he was going back today he reports he would like to go back tomorrow.  I provided a note to return back to work.   Portions of this note were generated with Scientist, clinical (histocompatibility and immunogenetics). Dictation errors may occur despite best attempts at proofreading.  Final Clinical Impression(s) / ED Diagnoses Final diagnoses:  Sore throat    Rx / DC Orders ED Discharge Orders    None       Claude Manges, PA-C 01/17/20 1624    Claude Manges, PA-C 01/17/20 1625    Charlynne Pander, MD 01/18/20 [phone]

## 2020-01-20 ENCOUNTER — Encounter (HOSPITAL_COMMUNITY): Payer: Self-pay

## 2020-01-20 ENCOUNTER — Other Ambulatory Visit: Payer: Self-pay

## 2020-01-20 ENCOUNTER — Emergency Department (HOSPITAL_COMMUNITY)
Admission: EM | Admit: 2020-01-20 | Discharge: 2020-01-20 | Disposition: A | Discharge status: Home / Self Care | Payer: Medicaid Other | Attending: Emergency Medicine | Admitting: Emergency Medicine

## 2020-01-20 DIAGNOSIS — J069 Acute upper respiratory infection, unspecified: Secondary | ICD-10-CM | POA: Insufficient documentation

## 2020-01-20 DIAGNOSIS — J209 Acute bronchitis, unspecified: Secondary | ICD-10-CM

## 2020-01-20 DIAGNOSIS — F17210 Nicotine dependence, cigarettes, uncomplicated: Secondary | ICD-10-CM | POA: Insufficient documentation

## 2020-01-20 DIAGNOSIS — Z79899 Other long term (current) drug therapy: Secondary | ICD-10-CM | POA: Insufficient documentation

## 2020-01-20 MED ORDER — ALBUTEROL SULFATE HFA 108 (90 BASE) MCG/ACT IN AERS
2.0000 | INHALATION_SPRAY | RESPIRATORY_TRACT | Status: DC | PRN
  Administered 2020-01-20: 2 via RESPIRATORY_TRACT
  Filled 2020-01-20: qty 6.7

## 2020-01-20 NOTE — ED Notes (Signed)
Pt verbalizes understanding of DC instructions. Pt belongings returned and is ambulatory out of ED.  

## 2020-01-20 NOTE — ED Provider Notes (Signed)
[facility]-EMERGENCY DEPT Provider Note   CSN: [id number] Arrival date & time: 01/20/20  1234     History Chief Complaint  Patient presents with  . Cough  . Nasal Congestion    Adam Guzman is a 24 y.o. male.  HPI  24 yo male ho neurofibromatosis presents today with cough.  Patient c.o nasal congestion began last week.  No fever or dyspnea.  Patient had covid vaccine phizer several months ago.  He is a smoker but states quit last night.      Past Medical History:  Diagnosis Date  . Neurofibromatosis [facility])     Patient Active Problem List   Diagnosis Date Noted  . Migraine variant with headache 10/08/2018  . Poor sleep hygiene 01/02/2018  . Pain in joint, shoulder region 03/20/2017  . Migraine without aura and without status migrainosus, not intractable 01/11/2016  . Neck pain on right side 07/13/2015  . Delayed milestones 12/10/2013  . 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 Problems with learning 12/10/2013  . GYNECOMASTIA, UNILATERAL 09/02/2007  . ECZEMA, ATOPIC DERMATITIS 10/24/2006    Past Surgical History:  Procedure Laterality Date  . WISDOM TOOTH EXTRACTION         Family History  Problem Relation Age of Onset  . Cancer Father        Died at 22  . Healthy Mother     Social History   Tobacco Use  . Smoking status: Current Every Day Smoker    Packs/day: 0.20    Types: Cigarettes  . Smokeless tobacco: Never Used  Substance Use Topics  . Alcohol use: Yes    Alcohol/week: 0.0 standard drinks    Comment: socially  . Drug use: No    Home Medications Prior to Admission medications   Medication Sig Start Date End Date Taking? Authorizing Provider  cephALEXin (KEFLEX) 500 MG capsule Take 1 capsule (500 mg total) by mouth 3 (three) times daily. 09/17/19   Wallis Bamberg, PA-C  cetirizine (ZYRTEC) 10 MG tablet Take 1 tablet (10 mg total) by mouth daily. Patient not taking: Reported on  05/20/2019 05/01/19   Linus Mako B, NP  naproxen (NAPROSYN) 500 MG tablet Take 1 tablet (500 mg total) by mouth 2 (two) times daily. 09/17/19   Wallis Bamberg, PA-C    Allergies    Patient has no known allergies.  Review of Systems   Review of Systems  All other systems reviewed and are negative.   Physical Exam Updated Vital Signs BP (!) 130/56 (BP Location: Left Arm)   Pulse 91   Temp 98.2 F (36.8 C) (Oral)   Resp 16   Ht 1.803 m (5\' 11" )   Wt 68 kg   SpO2 100%   BMI 20.92 kg/m   Physical Exam Vitals and nursing note reviewed.  Constitutional:      General: He is not in acute distress.    Appearance: Normal appearance. He is not ill-appearing.  HENT:     Head: Normocephalic and atraumatic.     Right Ear: External ear normal.     Left Ear: External ear normal.     Nose: Nose normal.     Mouth/Throat:     Mouth: Mucous membranes are moist.     Pharynx: Oropharynx is clear. No oropharyngeal exudate or posterior oropharyngeal erythema.  Eyes:     Pupils: Pupils are equal, round, and reactive to light.  Cardiovascular:     Rate and Rhythm: Normal  rate and regular rhythm.     Pulses: Normal pulses.  Pulmonary:     Effort: Pulmonary effort is normal.     Breath sounds: Normal breath sounds.  Abdominal:     General: Abdomen is flat.     Palpations: Abdomen is soft.  Musculoskeletal:        General: Normal range of motion.     Cervical back: Normal range of motion.  Skin:    General: Skin is warm.  Neurological:     General: No focal deficit present.     Mental Status: He is alert.  Psychiatric:        Mood and Affect: Mood normal.     ED Results / Procedures / Treatments   Labs (all labs ordered are listed, but only abnormal results are displayed) Labs Reviewed - No data to display  EKG None  Radiology No results found.  Procedures Procedures (including critical care time)  Medications Ordered in ED Medications  albuterol (VENTOLIN HFA) 108 (90  Base) MCG/ACT inhaler 2 puff (has no administration in time range)    ED Course  I have reviewed the triage vital signs and the nursing notes.  Pertinent labs & imaging results that were available during my care of the patient were reviewed by me and considered in my medical decision making (see chart for details).    MDM Rules/Calculators/A&P                     Final Clinical Impression(s) / ED Diagnoses Final diagnoses:  Viral upper respiratory tract infection  Acute bronchitis, unspecified organism    Rx / DC Orders ED Discharge Orders    None       Pattricia Boss, MD 01/20/20 1407

## 2020-01-20 NOTE — ED Triage Notes (Signed)
Patient c/o nasal congestion, cough. Patient states he was seen 3 days ago for a sore throat.

## 2020-01-20 NOTE — Discharge Instructions (Signed)
Use albuterol 2 puffs every four hours for coughing. Stop smoking Return if you are having shortness of breath or are worse at any time

## 2020-05-19 ENCOUNTER — Ambulatory Visit (INDEPENDENT_AMBULATORY_CARE_PROVIDER_SITE_OTHER): Payer: Medicaid Other | Admitting: Pediatrics

## 2020-05-23 ENCOUNTER — Encounter (HOSPITAL_COMMUNITY): Payer: Self-pay

## 2020-05-23 ENCOUNTER — Other Ambulatory Visit: Payer: Self-pay

## 2020-05-23 ENCOUNTER — Ambulatory Visit (HOSPITAL_COMMUNITY)
Admission: EM | Admit: 2020-05-23 | Discharge: 2020-05-23 | Disposition: A | Discharge status: Home / Self Care | Payer: Self-pay | Attending: Family Medicine | Admitting: Family Medicine

## 2020-05-23 DIAGNOSIS — N489 Disorder of penis, unspecified: Secondary | ICD-10-CM | POA: Insufficient documentation

## 2020-05-23 MED ORDER — CLOTRIMAZOLE-BETAMETHASONE 1-0.05 % EX CREA: TOPICAL_CREAM | CUTANEOUS | 0 refills | Status: DC

## 2020-05-23 NOTE — ED Triage Notes (Signed)
Pt states he has a "scar" on the head of his penis and it is painful to touch. No pain during urination. Pt is aox4 and ambulatory.

## 2020-05-23 NOTE — Discharge Instructions (Signed)
Swab sent for testing for herpes Use the cream as prescribed Follow up as needed for continued or worsening symptoms

## 2020-05-24 NOTE — ED Provider Notes (Signed)
[facility]    CSN: [id number] Arrival date & time: 05/23/20  1734      History   Chief Complaint Chief Complaint  Patient presents with  . Groin Swelling    x 1 week    HPI Adam Guzman is a 24 y.o. male.   Pt is a 24 year old male that presents with lesion to penis. This has been present for the past few days. He is sexually active with one partner unprotected. No drainage, dysuria, hematuria, urinary frequency.   ROS per HPI      Past Medical History:  Diagnosis Date  . Neurofibromatosis [facility])     Patient Active Problem List   Diagnosis Date Noted  . Migraine variant with headache 10/08/2018  . Poor sleep hygiene 01/02/2018  . Pain in joint, shoulder region 03/20/2017  . Migraine without aura and without status migrainosus, not intractable 01/11/2016  . Neck pain on right side 07/13/2015  . Delayed milestones 12/10/2013  . 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 Problems with learning 12/10/2013  . GYNECOMASTIA, UNILATERAL 09/02/2007  . ECZEMA, ATOPIC DERMATITIS 10/24/2006    Past Surgical History:  Procedure Laterality Date  . WISDOM TOOTH EXTRACTION         Home Medications    Prior to Admission medications   Medication Sig Start Date End Date Taking? Authorizing Provider  clotrimazole-betamethasone (LOTRISONE) cream Apply to affected area 2 times daily prn 05/23/20   [name], NP  cetirizine (ZYRTEC) 10 MG tablet Take 1 tablet (10 mg total) by mouth daily. Patient not taking: Reported on 05/20/2019 05/01/19 05/23/20  [name], NP    Family History Family History  Problem Relation Age of Onset  . Cancer Father        Died at 24  . Healthy Mother     Social History Social History   Tobacco Use  . Smoking status: Current Every Day Smoker    Packs/day: 0.20    Types: Cigarettes  . Smokeless tobacco: Never Used  Vaping Use  . Vaping Use: Some days  . Substances: Nicotine,  Flavoring  Substance Use Topics  . Alcohol use: Yes    Alcohol/week: 0.0 standard drinks    Comment: socially  . Drug use: No     Allergies   Patient has no known allergies.   Review of Systems Review of Systems   Physical Exam Triage Vital Signs ED Triage Vitals  Enc Vitals Group     BP 05/23/20 1854 128/69     Pulse Rate 05/23/20 1854 88     Resp 05/23/20 1854 17     Temp 05/23/20 1854 98.6 F (37 C)     Temp Source 05/23/20 1854 Oral     SpO2 05/23/20 1854 100 %     Weight --      Height --      Head Circumference --      Peak Flow --      Pain Score 05/23/20 1856 0     Pain Loc --      Pain Edu? --      Excl. in GC? --    No data found.  Updated Vital Signs BP 128/69 (BP Location: Right Arm)   Pulse 88   Temp 98.6 F (37 C) (Oral)   Resp 17   SpO2 100%   Visual Acuity Right Eye Distance:   Left Eye Distance:   Bilateral Distance:  Right Eye Near:   Left Eye Near:    Bilateral Near:     Physical Exam Vitals and nursing note reviewed.  Constitutional:      Appearance: Normal appearance.  HENT:     Head: Normocephalic and atraumatic.     Nose: Nose normal.  Eyes:     Conjunctiva/sclera: Conjunctivae normal.  Pulmonary:     Effort: Pulmonary effort is normal.  Genitourinary:    Comments: Very small open area to dorsum of penile shaft distal No specific rash, swelling, drainage Musculoskeletal:        General: Normal range of motion.     Cervical back: Normal range of motion.  Skin:    General: Skin is warm and dry.  Neurological:     Mental Status: He is alert.  Psychiatric:        Mood and Affect: Mood normal.      UC Treatments / Results  Labs (all labs ordered are listed, but only abnormal results are displayed) Labs Reviewed  HSV CULTURE AND TYPING    EKG   Radiology No results found.  Procedures Procedures (including critical care time)  Medications Ordered in UC Medications - No data to display  Initial  Impression / Assessment and Plan / UC Course  I have reviewed the triage vital signs and the nursing notes.  Pertinent labs & imaging results that were available during my care of the patient were reviewed by me and considered in my medical decision making (see chart for details).     Penile lesion Appears to be area of skin breakdown.  Not convinced of HSV, syphilis or bacterial infection at this time. Could be from sexual activity.  Will prescribe Lotrisone cream HSV swab pending.  Final Clinical Impressions(s) / UC Diagnoses   Final diagnoses:  Penile lesion     Discharge Instructions     Swab sent for testing for herpes Use the cream as prescribed Follow up as needed for continued or worsening symptoms     ED Prescriptions    Medication Sig Dispense Auth. Provider   clotrimazole-betamethasone (LOTRISONE) cream Apply to affected area 2 times daily prn 15 g Darcel Frane A, NP     PDMP not reviewed this encounter.   [name], NP 05/24/20 1342

## 2020-05-26 LAB — HSV CULTURE AND TYPING

## 2020-06-02 ENCOUNTER — Encounter (HOSPITAL_COMMUNITY): Payer: Self-pay

## 2020-06-02 ENCOUNTER — Other Ambulatory Visit: Payer: Self-pay

## 2020-06-02 ENCOUNTER — Emergency Department (HOSPITAL_COMMUNITY)
Admission: EM | Admit: 2020-06-02 | Discharge: 2020-06-02 | Disposition: A | Discharge status: Home / Self Care | Payer: Medicaid Other | Attending: Emergency Medicine | Admitting: Emergency Medicine

## 2020-06-02 DIAGNOSIS — K029 Dental caries, unspecified: Secondary | ICD-10-CM | POA: Insufficient documentation

## 2020-06-02 DIAGNOSIS — K0381 Cracked tooth: Secondary | ICD-10-CM | POA: Insufficient documentation

## 2020-06-02 DIAGNOSIS — F17210 Nicotine dependence, cigarettes, uncomplicated: Secondary | ICD-10-CM | POA: Insufficient documentation

## 2020-06-02 MED ORDER — CLINDAMYCIN HCL 300 MG PO CAPS
300.0000 mg | ORAL_CAPSULE | Freq: Once | ORAL | Status: AC
  Administered 2020-06-02: 23:00:00 300 mg via ORAL
  Filled 2020-06-02: qty 1

## 2020-06-02 MED ORDER — CLINDAMYCIN HCL 300 MG PO CAPS: 300.0000 mg | ORAL_CAPSULE | Freq: Three times a day (TID) | ORAL | 0 refills | Status: AC

## 2020-06-02 MED ORDER — OXYCODONE HCL 5 MG PO TABS
5.0000 mg | ORAL_TABLET | Freq: Once | ORAL | Status: AC
  Administered 2020-06-02: 23:00:00 5 mg via ORAL
  Filled 2020-06-02: qty 1

## 2020-06-02 NOTE — ED Triage Notes (Signed)
Patient arrived with complaints of left lower dental pain. Reports a tooth cracked a few weeks ago. States he has an appointment at a dentist tomorrow but "wants to get it pulled"

## 2020-06-02 NOTE — ED Provider Notes (Signed)
[facility]-EMERGENCY DEPT Provider Note   CSN: [id number] Arrival date & time: 06/02/20  2153     History Chief Complaint  Patient presents with  . Dental Pain    Adam Guzman is a 24 y.o. male.  The history is provided by the patient.  Dental Pain Location:  Lower Quality:  Aching Severity:  Mild Onset quality:  Gradual Timing:  Intermittent Progression:  Waxing and waning Chronicity:  New Context: dental caries   Relieved by:  Nothing Worsened by:  Nothing Associated symptoms: no congestion, no difficulty swallowing, no drooling, no facial pain, no facial swelling, no fever, no gum swelling, no headaches, no neck pain, no neck swelling, no oral bleeding, no oral lesions and no trismus        Past Medical History:  Diagnosis Date  . Neurofibromatosis [facility])     Patient Active Problem List   Diagnosis Date Noted  . Migraine variant with headache 10/08/2018  . Poor sleep hygiene 01/02/2018  . Pain in joint, shoulder region 03/20/2017  . Migraine without aura and without status migrainosus, not intractable 01/11/2016  . Neck pain on right side 07/13/2015  . Delayed milestones 12/10/2013  . 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 Problems with learning 12/10/2013  . GYNECOMASTIA, UNILATERAL 09/02/2007  . ECZEMA, ATOPIC DERMATITIS 10/24/2006    Past Surgical History:  Procedure Laterality Date  . WISDOM TOOTH EXTRACTION         Family History  Problem Relation Age of Onset  . Cancer Father        Died at 78  . Healthy Mother     Social History   Tobacco Use  . Smoking status: Current Every Day Smoker    Packs/day: 0.20    Types: Cigarettes  . Smokeless tobacco: Never Used  Vaping Use  . Vaping Use: Some days  . Substances: Nicotine, Flavoring  Substance Use Topics  . Alcohol use: Yes    Alcohol/week: 0.0 standard drinks    Comment: socially  . Drug use: No    Home  Medications Prior to Admission medications   Medication Sig Start Date End Date Taking? Authorizing Provider  clindamycin (CLEOCIN) 300 MG capsule Take 1 capsule (300 mg total) by mouth 3 (three) times daily for 10 days. 06/02/20 06/12/20  Virgina Norfolk, DO  clotrimazole-betamethasone (LOTRISONE) cream Apply to affected area 2 times daily prn 05/23/20   [name], NP  cetirizine (ZYRTEC) 10 MG tablet Take 1 tablet (10 mg total) by mouth daily. Patient not taking: Reported on 05/20/2019 05/01/19 05/23/20  [name], NP    Allergies    Patient has no known allergies.  Review of Systems   Review of Systems  Constitutional: Negative for fever.  HENT: Negative for congestion, drooling, facial swelling and mouth sores.   Musculoskeletal: Negative for neck pain.  Neurological: Negative for headaches.    Physical Exam Updated Vital Signs BP (!) 130/92   Pulse 83   Temp 98.6 F (37 C) (Oral)   Resp 16   SpO2 100%   Physical Exam Constitutional:      General: He is not in acute distress.    Appearance: He is not ill-appearing.  HENT:     Head: Normocephalic.     Comments: Multiple areas of dental caries to the left lower mouth however no abscess, no trismus, no drooling, no submandibular swelling    Nose: Nose normal.     Mouth/Throat:  Mouth: Mucous membranes are moist.     Pharynx: No oropharyngeal exudate or posterior oropharyngeal erythema.  Neurological:     Mental Status: He is alert.     ED Results / Procedures / Treatments   Labs (all labs ordered are listed, but only abnormal results are displayed) Labs Reviewed - No data to display  EKG None  Radiology No results found.  Procedures Procedures (including critical care time)  Medications Ordered in ED Medications  oxyCODONE (Oxy IR/ROXICODONE) immediate release tablet 5 mg (has no administration in time range)  clindamycin (CLEOCIN) capsule 300 mg (has no administration in time range)    ED  Course  I have reviewed the triage vital signs and the nursing notes.  Pertinent labs & imaging results that were available during my care of the patient were reviewed by me and considered in my medical decision making (see chart for details).    MDM Rules/Calculators/A&P                          Manual L [name] is a 24 year old male here for dental pain.  Left lower dental caries on exam.  No submandibular swelling, no trismus, no drooling, no obvious abscess.  Does have a dentist appointment tomorrow.  Given Roxicodone and clindamycin.  No concern for deep space infection such as Ludwig's.  Understands return precautions and discharged in ED in good condition.  This chart was dictated using voice recognition software.  Despite best efforts to proofread,  errors can occur which can change the documentation meaning.   Final Clinical Impression(s) / ED Diagnoses Final diagnoses:  Pain due to dental caries    Rx / DC Orders ED Discharge Orders         Ordered    clindamycin (CLEOCIN) 300 MG capsule  3 times daily        06/02/20 2229           Virgina Norfolk, DO 06/02/20 2232

## 2020-06-02 NOTE — ED Notes (Signed)
Discharged w/ no concerns at this time

## 2020-06-20 ENCOUNTER — Ambulatory Visit (INDEPENDENT_AMBULATORY_CARE_PROVIDER_SITE_OTHER): Payer: Medicaid Other | Admitting: Pediatrics

## 2020-10-18 ENCOUNTER — Telehealth (INDEPENDENT_AMBULATORY_CARE_PROVIDER_SITE_OTHER): Payer: Self-pay | Admitting: Pediatrics

## 2020-10-18 NOTE — Telephone Encounter (Signed)
Who's calling (name and relationship to patient) : Aki (self)  Best contact number: [phone]  Provider they see: Dr. Sharene Skeans  Reason for call:  Alim called in to schedule a follow up with Dr. Sharene Skeans, while on the phone he states that he has been having severe itching and dry skin. Does not know if this is related to his Neurofibromatosis. Please advise.    Call ID:      PRESCRIPTION REFILL ONLY  Name of prescription:  Pharmacy:

## 2020-10-18 NOTE — Telephone Encounter (Signed)
Tried to call and speak with patient. Voicemail box has not been set up. No way to leave voicemail. Will call back

## 2020-10-19 NOTE — Telephone Encounter (Signed)
This sounds like eczema.  If he does not have a primary physician, he may have to go through an urgent care.  At 25 years of age, he should probably get a primary physician.  I would suggest [facility].  Please call him.

## 2020-10-19 NOTE — Telephone Encounter (Signed)
Called patient back about his phone message. I verbalized the suggestion that Dr. Sharene Skeans gave him. He understood

## 2020-10-19 NOTE — Telephone Encounter (Signed)
Spoke with patient about his phone message. He states that he has been dealing with the severe itching and dry skin. He states that he has been dealing with it for a few months. I suggested that he call his PCP but he states that he does not have one. He states that he only sees Dr. Sharene Skeans. He states that he wonders if this is due to his diagnosis. Please advise.

## 2020-11-17 ENCOUNTER — Ambulatory Visit (INDEPENDENT_AMBULATORY_CARE_PROVIDER_SITE_OTHER): Payer: Self-pay | Admitting: Pediatrics

## 2020-12-20 ENCOUNTER — Ambulatory Visit (INDEPENDENT_AMBULATORY_CARE_PROVIDER_SITE_OTHER): Payer: Medicaid Other | Admitting: Pediatrics

## 2020-12-20 ENCOUNTER — Telehealth (INDEPENDENT_AMBULATORY_CARE_PROVIDER_SITE_OTHER): Payer: Self-pay | Admitting: Pediatrics

## 2020-12-20 ENCOUNTER — Encounter (INDEPENDENT_AMBULATORY_CARE_PROVIDER_SITE_OTHER): Payer: Self-pay | Admitting: Pediatrics

## 2020-12-20 NOTE — Telephone Encounter (Signed)
Patient no showed for scheduled appointment today with Dr. Sharene Skeans. I had a conversation with patient on 11/18/2020 advising if he no showed for another appointment he would be dismissed and no further appointments would be made. He stated his understanding. He failed to show for today's appointment. I called his number to advise we would not be able to schedule any further appointments. He did not answer and the voicemail was full. I have mailed a letter to his last known address advising of the dismissal from our practice. He will need to contact his primary care for a referral to [facility] or [facility] or contact them directly.  Barrington Ellison

## 2020-12-29 ENCOUNTER — Encounter (INDEPENDENT_AMBULATORY_CARE_PROVIDER_SITE_OTHER): Payer: Self-pay

## 2021-02-26 ENCOUNTER — Other Ambulatory Visit: Payer: Self-pay

## 2021-02-26 ENCOUNTER — Encounter (HOSPITAL_COMMUNITY): Payer: Self-pay | Admitting: Emergency Medicine

## 2021-02-26 ENCOUNTER — Ambulatory Visit (HOSPITAL_COMMUNITY)
Admission: EM | Admit: 2021-02-26 | Discharge: 2021-02-26 | Disposition: A | Discharge status: Home / Self Care | Payer: Medicaid Other | Attending: Family Medicine | Admitting: Family Medicine

## 2021-02-26 DIAGNOSIS — L0201 Cutaneous abscess of face: Secondary | ICD-10-CM

## 2021-02-26 MED ORDER — SULFAMETHOXAZOLE-TRIMETHOPRIM 800-160 MG PO TABS: 1.0000 | ORAL_TABLET | Freq: Two times a day (BID) | ORAL | 0 refills | Status: AC

## 2021-02-26 MED ORDER — MUPIROCIN 2 % EX OINT: 1.0000 "application " | TOPICAL_OINTMENT | Freq: Two times a day (BID) | CUTANEOUS | 0 refills | Status: DC

## 2021-02-26 NOTE — ED Triage Notes (Signed)
Pt is present today with a bump on his forehead. Pt states that he noticed it a couple months. Pt states that he did noticed drainage coming from the bump yesterday.

## 2021-02-26 NOTE — ED Provider Notes (Signed)
[facility]    CSN: [id number] Arrival date & time: 02/26/21  1251      History   Chief Complaint Chief Complaint  Patient presents with   Mass    HPI Adam Guzman is a 25 y.o. male.   Patient with history of neurofibromatosis type I presenting today with a mass to the left side of his forehead that he states has been present for several months now but recently got larger and has started draining blood and pus at times.  He has been trying to keep it clean and bought some patches of some sort from [employer] which have not helped whatsoever.  He denies fever, chills, headache, injury to the area.  Has never had anything like this in the past.   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 Authorizing Provider  mupirocin ointment (BACTROBAN) 2 % Apply 1 application topically 2 (two) times daily. 02/26/21  Yes Particia Nearing, PA-C  sulfamethoxazole-trimethoprim (BACTRIM DS) 800-160 MG tablet Take 1 tablet by mouth 2 (two) times daily for 7 days. 02/26/21 03/05/21 Yes Particia Nearing, PA-C  clotrimazole-betamethasone (LOTRISONE) cream Apply to affected area 2 times daily prn 05/23/20   [name], NP  cetirizine (ZYRTEC) 10 MG tablet Take  1 tablet (10 mg total) by mouth daily. Patient not taking: Reported on 05/20/2019 05/01/19 05/23/20  [name], NP    Family History Family History  Problem Relation Age of Onset   Cancer Father        Died at 34   Healthy Mother     Social History Social History   Tobacco Use   Smoking status: Every Day    Packs/day: 0.20    Pack years: 0.00    Types: Cigarettes   Smokeless tobacco: Never  Vaping Use   Vaping Use: Some days   Substances: Nicotine, Flavoring  Substance Use Topics   Alcohol use: Yes    Alcohol/week: 0.0 standard drinks    Comment: socially   Drug use: No     Allergies   Patient has no known allergies.   Review of Systems Review of Systems Per HPI  Physical Exam Triage Vital Signs ED Triage Vitals  Enc Vitals Group     BP 02/26/21 1411 118/71     Pulse Rate 02/26/21 1411 85     Resp 02/26/21 1411 18     Temp 02/26/21 1411 99 F (37.2 C)     Temp Source 02/26/21 1411 Oral     SpO2 02/26/21 1411 100 %     Weight --      Height --      Head Circumference --      Peak Flow --  Pain Score 02/26/21 1410 0     Pain Loc --      Pain Edu? --      Excl. in GC? --    No data found.  Updated Vital Signs BP 118/71   Pulse 85   Temp 99 F (37.2 C) (Oral)   Resp 18   SpO2 100%   Visual Acuity Right Eye Distance:   Left Eye Distance:   Bilateral Distance:    Right Eye Near:   Left Eye Near:    Bilateral Near:     Physical Exam Vitals and nursing note reviewed.  Constitutional:      Appearance: Normal appearance.  HENT:     Head: Atraumatic.  Eyes:     Extraocular Movements: Extraocular movements intact.     Conjunctiva/sclera: Conjunctivae normal.  Cardiovascular:     Rate and Rhythm: Normal rate and regular rhythm.  Pulmonary:     Effort: Pulmonary effort is normal.     Breath sounds: Normal breath sounds.  Musculoskeletal:        General: Normal range of motion.     Cervical back: Normal range of motion and neck  supple.  Skin:    General: Skin is warm.     Comments: Small infected sebaceous cyst to medial left eyebrow, has already spontaneously opened but has small amounts of fluctuance remaining.  No significant erythema or induration  Neurological:     General: No focal deficit present.     Mental Status: He is oriented to person, place, and time.  Psychiatric:        Mood and Affect: Mood normal.        Thought Content: Thought content normal.        Judgment: Judgment normal.     UC Treatments / Results  Labs (all labs ordered are listed, but only abnormal results are displayed) Labs Reviewed - No data to display  EKG   Radiology No results found.  Procedures Procedures (including critical care time)  Medications Ordered in UC Medications - No data to display  Initial Impression / Assessment and Plan / UC Course  I have reviewed the triage vital signs and the nursing notes.  Pertinent labs & imaging results that were available during my care of the patient were reviewed by me and considered in my medical decision making (see chart for details).     Appears to be an infected sebaceous cyst of the forehead near eyebrow.  Has already been spontaneously draining.  Discussed warm compresses good wound care and Bactrim, Bactroban ointment.  Follow-up with PCP for recheck in 1 week.  Return sooner for acutely worsening symptoms.  Final Clinical Impressions(s) / UC Diagnoses   Final diagnoses:  Facial abscess   Discharge Instructions   None    ED Prescriptions     Medication Sig Dispense Auth. Provider   sulfamethoxazole-trimethoprim (BACTRIM DS) 800-160 MG tablet Take 1 tablet by mouth 2 (two) times daily for 7 days. 14 tablet Particia Nearing, [place]   mupirocin ointment (BACTROBAN) 2 % Apply 1 application topically 2 (two) times daily. 22 g Particia Nearing, [place]      PDMP not reviewed this encounter.   Particia Nearing, [place] 02/26/21 1525

## 2021-03-15 ENCOUNTER — Ambulatory Visit (INDEPENDENT_AMBULATORY_CARE_PROVIDER_SITE_OTHER): Payer: Self-pay | Admitting: Family Medicine

## 2021-03-15 ENCOUNTER — Other Ambulatory Visit: Payer: Self-pay

## 2021-03-15 VITALS — BP 126/70 | HR 97 | Ht 71.0 in | Wt 153.1 lb

## 2021-03-15 DIAGNOSIS — R519 Headache, unspecified: Secondary | ICD-10-CM

## 2021-03-15 DIAGNOSIS — G8929 Other chronic pain: Secondary | ICD-10-CM

## 2021-03-15 DIAGNOSIS — G43809 Other migraine, not intractable, without status migrainosus: Secondary | ICD-10-CM

## 2021-03-15 DIAGNOSIS — L989 Disorder of the skin and subcutaneous tissue, unspecified: Secondary | ICD-10-CM

## 2021-03-15 MED ORDER — DOXYCYCLINE HYCLATE 100 MG PO TABS: 100.0000 mg | ORAL_TABLET | Freq: Two times a day (BID) | ORAL | 0 refills | Status: AC

## 2021-03-15 NOTE — Progress Notes (Signed)
   SUBJECTIVE:   CHIEF COMPLAINT / HPI:   Chief Complaint  Patient presents with   Establish Care     Adam Guzman is a 25 y.o. male here to establish care. He has history of neurofibromatosis. Previously followed with Dr. Sharene Skeans, peds neurologist. Has not seen a neurologist in a couple of years. Reports chronic headaches. Headaches are unchanged from previous. Takes OTC pain medication with occasional relief.  Has nausea, light headed, dizziness, unable to walk if gets too bad. Endorses photophobia. Denies phonophobia., new weakness, fever, confusion.  Has headaches 2-3 times a week.  Smokes 1 ppd of cigarettes since 2018.  EtOH once a week. No illicit drug use.    PERTINENT  PMH / PSH: reviewed and updated as appropriate   OBJECTIVE:   BP 126/70   Pulse 97   Ht 5\' 11"  (1.803 m)   Wt 153 lb 2 oz (69.5 kg)   SpO2 98%   BMI 21.36 kg/m     GEN: pleasant, well appearing male, in no acute distress  CV: regular rate and rhythm RESP: no increased work of breathing, clear to ascultation bilaterally MSK: normal extremity ROM SKIN: warm, dry, see image below NEURO: CN 2-12 grossly intact, gross sensation intact, moves all extremities appropriately, normal speech, EOM intact, alert and oriented     ASSESSMENT/PLAN:   Migraine variant with headache, Uncontrolled Given pt's history of neurofibromatosis imaging warranted. CT Head vs MR Brain. Referral to neurology for evaluation. Previously followed with Dr. , pediatric neurology. Continue OTC pain medication.    Face Lesion Abscess vs sebaceous cyst vs neurofibroma. Reports drainage. Trial Doxycycline 100 mg BID for 7 days. If not resolved, consider excision in dermatology clinic. Pt agrees with plan.   Follow up in 3 months. Sooner if needed.  Sharene Skeans, DO PGY-3, [facility] 03/15/2021

## 2021-03-15 NOTE — Patient Instructions (Addendum)
It was great seeing you today!  Please check-out at the front desk before leaving the clinic. I'd like to see you back in 1-2 weeks if not improving.   Visit Remembers: - Stop by the pharmacy to pick up your antibiotics.  - Continue to work on your healthy eating habits and incorporating exercise into your daily life.  - Your goal is to have an BP < 120/80   Regarding lab work today:  Due to recent changes in healthcare laws, you may see the results of your imaging and laboratory studies on MyChart before your provider has had a chance to review them.  I understand that in some cases there may be results that are confusing or concerning to you. Not all laboratory results come back in the same time frame and you may be waiting for multiple results in order to interpret others.  Please give [place] 72 hours in order for your provider to thoroughly review all the results before contacting the office for clarification of your results. If everything is normal, you will get a letter in the mail or a message in My Chart. Please give [place] a call if you do not hear from [place] after 2 weeks.  Please bring all of your medications with you to each visit.    If you haven't already, sign up for My Chart to have easy access to your labs results, and communication with your primary care physician.  Feel free to call with any questions or concerns at any time, at [phone].   Take care,  Dr. Katherina [facility]

## 2021-03-17 ENCOUNTER — Encounter: Payer: Self-pay | Admitting: Neurology

## 2021-03-17 NOTE — Assessment & Plan Note (Signed)
Given pt's history of neurofibromatosis imaging warranted. CT Head vs MR Brain. Referral to neurology for evaluation. Previously followed with Dr. Sharene Skeans, pediatric neurology. Continue OTC pain medication.

## 2021-04-13 ENCOUNTER — Other Ambulatory Visit: Payer: Self-pay

## 2021-04-13 ENCOUNTER — Ambulatory Visit (INDEPENDENT_AMBULATORY_CARE_PROVIDER_SITE_OTHER): Payer: Self-pay | Admitting: Family Medicine

## 2021-04-13 VITALS — BP 112/60 | HR 93 | Ht 71.0 in | Wt 152.2 lb

## 2021-04-13 DIAGNOSIS — L989 Disorder of the skin and subcutaneous tissue, unspecified: Secondary | ICD-10-CM

## 2021-04-13 DIAGNOSIS — Q8501 Neurofibromatosis, type 1: Secondary | ICD-10-CM

## 2021-04-13 NOTE — Patient Instructions (Signed)
I believe that the lesions you have on your face could be related to your neurofibromatosis. If the area appears infected or you become concerned that it needs drainage please make sure to come back to the office.

## 2021-04-13 NOTE — Progress Notes (Signed)
    SUBJECTIVE:   CHIEF COMPLAINT / HPI:   Bump on face Patient reports having a lesion on his face for a few months. When manipulating the area he was able to get pus and blood out of it and was given Bactrim for treatment. It has decreased in size but has not completely resolved.   PERTINENT  PMH / PSH: Reviewed  OBJECTIVE:   BP 112/60   Pulse 93   Ht 5\' 11"  (1.803 m)   Wt 152 lb 3.2 oz (69 kg)   SpO2 98%   BMI 21.23 kg/m   General: NAD, well-appearing, well-nourished Respiratory: No respiratory distress, breathing comfortably, able to speak in full sentences Skin: warm and dry, small flesh colored papule on the upper medial border of the left eyebrow with no tension or fluctuance, hair follicle present in the center  Psych: Appropriate affect and mood   ASSESSMENT/PLAN:   Facial lesion Facial lesion without signs of infection. Does not appear consistent with cyst or folliculitis. Consider that this may be related to patient's neurofibromatosis. Return precautions given with regards to signs of infection and indications for I&D.   , DO [facility]

## 2021-05-17 ENCOUNTER — Ambulatory Visit (INDEPENDENT_AMBULATORY_CARE_PROVIDER_SITE_OTHER): Payer: Self-pay

## 2021-05-17 ENCOUNTER — Other Ambulatory Visit: Payer: Self-pay

## 2021-05-17 DIAGNOSIS — Z23 Encounter for immunization: Secondary | ICD-10-CM

## 2021-06-05 NOTE — Progress Notes (Signed)
NEUROLOGY CONSULTATION NOTE  KARIN GRIFFITH MRN: [id number] DOB: [birth year]  Referring provider: Katha Cabal, DO Primary care provider: Katha Cabal, DO  Reason for consult:  neurofibromatosis, headaches  Assessment/Plan:   Neurofibromatosis type 1 Tension-type headache, not intractable  1  Will repeat MRI of brain with and without contrast 2  Limit use of pain relievers to no more than 2 days out of week to prevent risk of rebound or medication-overuse headache. 3  Follow up in one year or as needed.   Subjective:  Adam Guzman is a 25 year old male who presents for neurofibromatosis and headaches.  History supplemented by pediatric neurology notes.  Patient has neurofibromatosis type 1.  He has been followed for many years by Dr. Ellison Carwin, who has just recently retired.  He denies vision changes.  He has not had any surgeries removing neurofibromas, however it was once recommended to have the neurofibroma over left parieto-occipital scalp removed.  He declined as he was told that it may still recur.  More recently, he has been concerned about a small bump above his left eye for several months.  It was evaluated in Urgent Care back in July after it started draining pus and blood.  He was diagnosed with an infected sebaceous cyst which was treated with Bactrim and warm compresses.  Still with a bump but no longer draining.    He also has headache.  Right occipital moderate sharp pain associated with photophobia but no nausea, vomiting, phonophobia, or visual disturbance.  Lasts 20-30 minutes with ibuprofen or Tylenol.  Used to occur 3 days a week, but now once a week.   Uncomplicated birth via NSVD.  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  No enhancement associated with this lesion.  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    Recommended surgery left occipital region   Bump over left eye.   No eye exam  -  no  PAST MEDICAL HISTORY: Past Medical History:  Diagnosis Date   Neurofibromatosis (HCC)     PAST SURGICAL HISTORY: Past Surgical History:  Procedure Laterality Date   WISDOM TOOTH EXTRACTION      MEDICATIONS: Current Outpatient Medications on File Prior to Visit  Medication Sig Dispense Refill   clotrimazole-betamethasone (LOTRISONE) cream Apply to affected area 2 times daily prn 15 g 0   [DISCONTINUED] cetirizine (ZYRTEC) 10 MG tablet Take 1 tablet (10 mg total) by mouth daily. (Patient not taking: Reported on 05/20/2019) 30 tablet 0   No current facility-administered medications on file prior to visit.    ALLERGIES: No Known Allergies  FAMILY HISTORY: Family History  Problem Relation Age of Onset   Cancer Father        Died at 34   Healthy Mother     Objective:  Blood pressure 126/73, pulse 100, height 5\' 8"  (1.727 m), weight 150 lb (68 kg), SpO2 99 %. General: No acute distress.  Patient appears well-groomed.   Head:  Normocephalic/atraumatic Eyes:  fundi examined but not visualized Neck: supple, no paraspinal  tenderness, full range of motion Back: No paraspinal tenderness Heart: regular rate and rhythm Lungs: Clear to auscultation bilaterally. Skin:  multiple cafe au lait spots and small subcutaneous neurofibromas on trunk and extremities.  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 Neurological Exam: Mental status: alert and oriented to person, place, and time,  recent and remote memory intact, fund of knowledge intact, attention and concentration intact, speech fluent and not dysarthric, language intact. Cranial nerves: CN I: not tested CN II: pupils equal, round and reactive to light, visual fields intact CN III, IV, VI:  full range of motion, no nystagmus, no ptosis CN V: facial sensation intact. CN VII: upper and lower face symmetric CN VIII: hearing intact CN IX, X: gag intact, uvula midline CN XI: sternocleidomastoid and trapezius muscles intact CN XII: tongue midline Bulk & Tone: normal, no fasciculations. Motor:  muscle strength 5/5 throughout Sensation:  Pinprick, temperature and vibratory sensation intact. Deep Tendon Reflexes:  2+ throughout,  toes downgoing.   Finger to nose testing:  Without dysmetria.   Heel to shin:  Without dysmetria.   Gait:  Normal station and stride.  Romberg negative.    Thank you for allowing me to take part in the care of this patient.  Shon Millet, DO  CC: Katha Cabal, DO

## 2021-06-06 ENCOUNTER — Encounter: Payer: Self-pay | Admitting: Neurology

## 2021-06-06 ENCOUNTER — Other Ambulatory Visit: Payer: Self-pay

## 2021-06-06 ENCOUNTER — Ambulatory Visit (INDEPENDENT_AMBULATORY_CARE_PROVIDER_SITE_OTHER): Payer: Self-pay | Admitting: Neurology

## 2021-06-06 VITALS — BP 126/73 | HR 100 | Ht 68.0 in | Wt 150.0 lb

## 2021-06-06 DIAGNOSIS — Q8501 Neurofibromatosis, type 1: Secondary | ICD-10-CM

## 2021-06-06 DIAGNOSIS — G44219 Episodic tension-type headache, not intractable: Secondary | ICD-10-CM

## 2021-06-06 NOTE — Patient Instructions (Signed)
Check MRI of brain with and without contrast For headaches, limit use of pain relievers to no more than 2 days out of week to prevent rebound headache Follow up one year or as needed.

## 2021-07-12 NOTE — Progress Notes (Signed)
Patient presents to nurse clinic for flu vaccination. Administered in LD, site unremarkable, tolerated injection well.   Letizia Hook C Heberto Sturdevant, RN   

## 2021-08-18 ENCOUNTER — Emergency Department (HOSPITAL_COMMUNITY)
Admission: EM | Admit: 2021-08-18 | Discharge: 2021-08-19 | Disposition: A | Discharge status: Home / Self Care | Payer: Medicaid Other | Attending: Emergency Medicine | Admitting: Emergency Medicine

## 2021-08-18 ENCOUNTER — Encounter (HOSPITAL_COMMUNITY): Payer: Self-pay

## 2021-08-18 ENCOUNTER — Other Ambulatory Visit: Payer: Self-pay

## 2021-08-18 DIAGNOSIS — F17210 Nicotine dependence, cigarettes, uncomplicated: Secondary | ICD-10-CM | POA: Insufficient documentation

## 2021-08-18 DIAGNOSIS — K0889 Other specified disorders of teeth and supporting structures: Secondary | ICD-10-CM | POA: Insufficient documentation

## 2021-08-18 NOTE — ED Triage Notes (Signed)
Pt reports with right sided dental pain since yesterday. Pt states that his right jaw is swollen.

## 2021-08-19 ENCOUNTER — Other Ambulatory Visit: Payer: Self-pay

## 2021-08-19 ENCOUNTER — Encounter (HOSPITAL_COMMUNITY): Payer: Self-pay | Admitting: Student

## 2021-08-19 ENCOUNTER — Encounter (HOSPITAL_COMMUNITY): Payer: Self-pay

## 2021-08-19 ENCOUNTER — Emergency Department (HOSPITAL_COMMUNITY)
Admission: EM | Admit: 2021-08-19 | Discharge: 2021-08-19 | Disposition: A | Discharge status: Home / Self Care | Payer: Medicaid Other | Attending: Emergency Medicine | Admitting: Emergency Medicine

## 2021-08-19 DIAGNOSIS — F17210 Nicotine dependence, cigarettes, uncomplicated: Secondary | ICD-10-CM | POA: Insufficient documentation

## 2021-08-19 DIAGNOSIS — R6884 Jaw pain: Secondary | ICD-10-CM | POA: Insufficient documentation

## 2021-08-19 DIAGNOSIS — K0889 Other specified disorders of teeth and supporting structures: Secondary | ICD-10-CM | POA: Insufficient documentation

## 2021-08-19 MED ORDER — LIDOCAINE VISCOUS HCL 2 % MT SOLN: 15.0000 mL | OROMUCOSAL | 0 refills | Status: DC | PRN

## 2021-08-19 MED ORDER — KETOROLAC TROMETHAMINE 30 MG/ML IJ SOLN
30.0000 mg | Freq: Once | INTRAMUSCULAR | Status: AC
  Administered 2021-08-19: 10:00:00 30 mg via INTRAMUSCULAR
  Filled 2021-08-19: qty 1

## 2021-08-19 MED ORDER — LIDOCAINE VISCOUS HCL 2 % MT SOLN: 5.0000 mL | Freq: Three times a day (TID) | OROMUCOSAL | 0 refills | Status: DC | PRN

## 2021-08-19 MED ORDER — LIDOCAINE VISCOUS HCL 2 % MT SOLN
15.0000 mL | Freq: Once | OROMUCOSAL | Status: AC
  Administered 2021-08-19: 03:00:00 15 mL via OROMUCOSAL
  Filled 2021-08-19: qty 15

## 2021-08-19 MED ORDER — AMOXICILLIN-POT CLAVULANATE 875-125 MG PO TABS: 1.0000 | ORAL_TABLET | Freq: Two times a day (BID) | ORAL | 0 refills | Status: DC

## 2021-08-19 MED ORDER — IBUPROFEN 800 MG PO TABS: 800.0000 mg | ORAL_TABLET | Freq: Three times a day (TID) | ORAL | 0 refills | Status: DC

## 2021-08-19 MED ORDER — AMOXICILLIN-POT CLAVULANATE 875-125 MG PO TABS
1.0000 | ORAL_TABLET | Freq: Once | ORAL | Status: AC
  Administered 2021-08-19: 03:00:00 1 via ORAL
  Filled 2021-08-19: qty 1

## 2021-08-19 MED ORDER — NAPROXEN 500 MG PO TABS: 500.0000 mg | ORAL_TABLET | Freq: Two times a day (BID) | ORAL | 0 refills | Status: DC | PRN

## 2021-08-19 MED ORDER — KETOROLAC TROMETHAMINE 15 MG/ML IJ SOLN
30.0000 mg | Freq: Once | INTRAMUSCULAR | Status: AC
  Administered 2021-08-19: 03:00:00 30 mg via INTRAMUSCULAR
  Filled 2021-08-19: qty 2

## 2021-08-19 NOTE — ED Provider Notes (Signed)
[facility]-EMERGENCY DEPT Provider Note   CSN: [id number] Arrival date & time: 08/19/21  6712     History Chief Complaint  Patient presents with   Dental Pain    Adam Guzman is a 25 y.o. male.   Dental Pain Associated symptoms: no fever    Patient presents with right upper jaw dental pain.  Happened acutely 2 days ago, the pain is been constant.  Initially had some swelling to the right upper jaw which has been improving with the Augmentin.  Was seen at the urgent care yesterday, started on naproxen and Augmentin twice daily.  Reports the pain is still persisting, is aggravated by chewing.  No fevers at home, no shortness of breath, chest pain, rashes, worsening spite facial swelling.  Past Medical History:  Diagnosis Date   Neurofibromatosis [facility])     Patient Active Problem List   Diagnosis Date Noted   Migraine variant with headache 10/08/2018   Poor sleep hygiene 01/02/2018   Pain in joint, shoulder region 03/20/2017   Migraine without aura and without status migrainosus, not intractable 01/11/2016   Neck pain on right side 07/13/2015   Delayed milestones 12/10/2013   Neurofibromatosis, type 1 (von Recklinghausen's disease) (HCC) 12/10/2013   Problems with learning 12/10/2013   GYNECOMASTIA, UNILATERAL 09/02/2007   ECZEMA, ATOPIC DERMATITIS 10/24/2006    Past Surgical History:  Procedure Laterality Date   WISDOM TOOTH EXTRACTION         Family History  Problem Relation Age of Onset   Cancer Father        Died at 22   Healthy Mother     Social History   Tobacco Use   Smoking status: Every Day    Packs/day: 0.20    Types: Cigarettes   Smokeless tobacco: Never  Vaping Use   Vaping Use: Some days   Substances: Nicotine, Flavoring  Substance Use Topics   Alcohol use: Yes    Alcohol/week: 0.0 standard drinks    Comment: socially   Drug use: No    Home Medications Prior to Admission medications   Medication Sig Start Date  End Date Taking? Authorizing Provider  ibuprofen (ADVIL) 800 MG tablet Take 1 tablet (800 mg total) by mouth 3 (three) times daily. 08/19/21  Yes Theron Arista, PA-C  magic mouthwash (lidocaine, diphenhydrAMINE, alum & mag hydroxide) suspension Swish and spit 5 mLs 3 (three) times daily as needed for mouth pain. 08/19/21  Yes Theron Arista, PA-C  amoxicillin-clavulanate (AUGMENTIN) 875-125 MG tablet Take 1 tablet by mouth every 12 (twelve) hours. 08/19/21   Petrucelli, Pleas Koch, PA-C  clotrimazole-betamethasone (LOTRISONE) cream Apply to affected area 2 times daily prn 05/23/20   [name], NP  naproxen (NAPROSYN) 500 MG tablet Take 1 tablet (500 mg total) by mouth 2 (two) times daily as needed for moderate pain. 08/19/21   Petrucelli, Samantha R, PA-C  cetirizine (ZYRTEC) 10 MG tablet Take 1 tablet (10 mg total) by mouth daily. Patient not taking: Reported on 05/20/2019 05/01/19 05/23/20  [name], NP    Allergies    Patient has no known allergies.  Review of Systems   Review of Systems  Constitutional:  Negative for fever.  HENT:  Positive for dental problem.    Physical Exam Updated Vital Signs Pulse 81    Temp (!) 97.4 F (36.3 C) (Oral)    Resp 16    Ht 6' (1.829 m)    Wt 71.7 kg    SpO2  100%    BMI 21.43 kg/m   Physical Exam Vitals and nursing note reviewed. Exam conducted with a chaperone present.  Constitutional:      Appearance: Normal appearance.  HENT:     Head: Normocephalic.     Mouth/Throat:     Mouth: Mucous membranes are moist.     Pharynx: No oropharyngeal exudate or posterior oropharyngeal erythema.     Comments: No sublingual tenderness or swelling. Uvula is midline. No trismus. No dry sockets or pulp exposure. Handling secretions without difficulty.  Some mild swelling to the right upper jaw, tenderness with palpation.  Slight fluctuance noted but no drainable abscess Eyes:     Extraocular Movements: Extraocular movements intact.     Pupils: Pupils are  equal, round, and reactive to light.  Cardiovascular:     Rate and Rhythm: Normal rate and regular rhythm.  Pulmonary:     Effort: Pulmonary effort is normal.     Breath sounds: Normal breath sounds.  Musculoskeletal:     Cervical back: Normal range of motion. No rigidity or tenderness.  Neurological:     Mental Status: He is alert.  Psychiatric:        Mood and Affect: Mood normal.    ED Results / Procedures / Treatments   Labs (all labs ordered are listed, but only abnormal results are displayed) Labs Reviewed - No data to display  EKG None  Radiology No results found.  Procedures Procedures   Medications Ordered in ED Medications  ketorolac (TORADOL) 30 MG/ML injection 30 mg (30 mg Intramuscular Given 08/19/21 2458)    ED Course  I have reviewed the triage vital signs and the nursing notes.  Pertinent labs & imaging results that were available during my care of the patient were reviewed by me and considered in my medical decision making (see chart for details).    MDM Rules/Calculators/A&P                         Stable vitals, patient nontoxic.  Not febrile, physical exam not consistent with Ludewig angina, PTA, retropharyngeal abscess.  Facial swelling has improved with Augmentin, reassuring sign.  Encouraged patient to continue the Augmentin, will put him on ibuprofen 3 times a day instead of the naproxen to see if that helps improve his pain.  He has a Education officer, community, encouraged to follow-up next week.  Patient discharged in stable condition, do not think additional evaluation needed at this time.     Final Clinical Impression(s) / ED Diagnoses Final diagnoses:  Pain, dental    Rx / DC Orders ED Discharge Orders          Ordered    ibuprofen (ADVIL) 800 MG tablet  3 times daily        08/19/21 0934    magic mouthwash (lidocaine, diphenhydrAMINE, alum & mag hydroxide) suspension  3 times daily PRN        08/19/21 0934             Theron Arista,  PA-C 08/19/21 0944    Lorre Nick, MD 08/20/21 0800

## 2021-08-19 NOTE — Discharge Instructions (Addendum)
Call one of the dentists offices provided to schedule an appointment for re-evaluation and further management within the next 48 hours.   we have prescribed you Augmentin which is an antibiotic to treat the infection, viscous lidocaine which is a numbing medicine, and Naproxen which is an anti-inflammatory medicine to treat the pain.   Please take all of your antibiotics until finished. You may develop abdominal discomfort or diarrhea from the antibiotic.  You may help offset this with probiotics which you can buy at the store (ask your pharmacist if unable to find) or get probiotics in the form of eating yogurt. Do not eat or take the probiotics until 2 hours after your antibiotic. If you are unable to tolerate these side effects follow-up with your primary care provider or return to the emergency department.   If you begin to experience any blistering, rashes, swelling, or difficulty breathing seek medical care for evaluation of potentially more serious side effects.   Be sure to eat something when taking the Naproxen as it can cause stomach upset and at worst stomach bleeding. Do not take additional non steroidal anti-inflammatory medicines such as Ibuprofen, Aleve, Advil, Mobic, Diclofenac, or goodie powder while taking Naproxen. You may supplement with Tylenol.   We have prescribed you new medication(s) today. Discuss the medications prescribed today with your pharmacist as they can have adverse effects and interactions with your other medicines including over the counter and prescribed medications. Seek medical evaluation if you start to experience new or abnormal symptoms after taking one of these medicines, seek care immediately if you start to experience difficulty breathing, feeling of your throat closing, facial swelling, or rash as these could be indications of a more serious allergic reaction  If you start to experience and new or worsening symptoms return to the emergency department. If you  start to experience fever, chills, neck stiffness/pain, or inability to move your neck or open your mouth come back to the emergency department immediately.

## 2021-08-19 NOTE — Discharge Instructions (Signed)
Continue augmentin Ibuprofen 3x daily Use the magic mouth wash for breakthrough pain

## 2021-08-19 NOTE — ED Provider Notes (Signed)
[facility]-EMERGENCY DEPT Provider Note   CSN: [id number] Arrival date & time: 08/18/21  2150     History Chief Complaint  Patient presents with   Dental Pain    Adam Guzman is a 25 y.o. male with a history of neurofibromatosis and migraines who presents to the ED with complaints of right upper dental pain since yesterday. Constant, progressively worsening, associated mild facial swelling. No alleviating/aggravating factors, tried taking excedrin without relief.  Has a cracked tooth that is causing similar problems in the past in this location.  He denies fever, nausea, vomiting, dysphagia, or shortness of breath.  HPI     Past Medical History:  Diagnosis Date   Neurofibromatosis [facility])     Patient Active Problem List   Diagnosis Date Noted   Migraine variant with headache 10/08/2018   Poor sleep hygiene 01/02/2018   Pain in joint, shoulder region 03/20/2017   Migraine without aura and without status migrainosus, not intractable 01/11/2016   Neck pain on right side 07/13/2015   Delayed milestones 12/10/2013   Neurofibromatosis, type 1 (von Recklinghausen's disease) (HCC) 12/10/2013   Problems with learning 12/10/2013   GYNECOMASTIA, UNILATERAL 09/02/2007   ECZEMA, ATOPIC DERMATITIS 10/24/2006    Past Surgical History:  Procedure Laterality Date   WISDOM TOOTH EXTRACTION         Family History  Problem Relation Age of Onset   Cancer Father        Died at 4   Healthy Mother     Social History   Tobacco Use   Smoking status: Every Day    Packs/day: 0.20    Types: Cigarettes   Smokeless tobacco: Never  Vaping Use   Vaping Use: Some days   Substances: Nicotine, Flavoring  Substance Use Topics   Alcohol use: Yes    Alcohol/week: 0.0 standard drinks    Comment: socially   Drug use: No    Home Medications Prior to Admission medications   Medication Sig Start Date End Date Taking? Authorizing Provider   clotrimazole-betamethasone (LOTRISONE) cream Apply to affected area 2 times daily prn 05/23/20   [name], NP  cetirizine (ZYRTEC) 10 MG tablet Take 1 tablet (10 mg total) by mouth daily. Patient not taking: Reported on 05/20/2019 05/01/19 05/23/20  [name], NP    Allergies    Patient has no known allergies.  Review of Systems   Review of Systems  Constitutional:  Negative for chills and fever.  HENT:  Positive for dental problem and facial swelling. Negative for drooling and trouble swallowing.   Respiratory:  Negative for shortness of breath.   Cardiovascular:  Negative for chest pain.  Gastrointestinal:  Negative for nausea and vomiting.  All other systems reviewed and are negative.  Physical Exam Updated Vital Signs BP (!) 123/98    Pulse 81    Temp 97.8 F (36.6 C) (Oral)    Resp 18    Ht 6' (1.829 m)    Wt 71.7 kg    SpO2 99%    BMI 21.43 kg/m   Physical Exam Vitals and nursing note reviewed.  Constitutional:      General: He is not in acute distress.    Appearance: He is well-developed. He is not toxic-appearing.  HENT:     Head: Normocephalic and atraumatic.     Right Ear: Tympanic membrane is not perforated, erythematous, retracted or bulging.     Left Ear: Tympanic membrane is not perforated, erythematous, retracted or  bulging.     Nose: Nose normal.     Mouth/Throat:     Pharynx: Uvula midline. No oropharyngeal exudate or posterior oropharyngeal erythema.      Comments: Very mild facial swelling noted to the right cheek area.  Posterior oropharynx is symmetric appearing. Patient tolerating own secretions without difficulty. No trismus. No drooling. No hot potato voice. No swelling beneath the tongue, submandibular compartment is soft.    Eyes:     General:        Right eye: No discharge.        Left eye: No discharge.     Conjunctiva/sclera: Conjunctivae normal.  Musculoskeletal:     Cervical back: Normal range of motion and neck supple.   Lymphadenopathy:     Cervical: No cervical adenopathy.  Neurological:     Mental Status: He is alert.  Psychiatric:        Behavior: Behavior normal.        Thought Content: Thought content normal.    ED Results / Procedures / Treatments   Labs (all labs ordered are listed, but only abnormal results are displayed) Labs Reviewed - No data to display  EKG None  Radiology No results found.  Procedures Procedures   Medications Ordered in ED Medications  lidocaine (XYLOCAINE) 2 % viscous mouth solution 15 mL (has no administration in time range)  ketorolac (TORADOL) 15 MG/ML injection 30 mg (has no administration in time range)  amoxicillin-clavulanate (AUGMENTIN) 875-125 MG per tablet 1 tablet (has no administration in time range)    ED Course  I have reviewed the triage vital signs and the nursing notes.  Pertinent labs & imaging results that were available during my care of the patient were reviewed by me and considered in my medical decision making (see chart for details).    MDM Rules/Calculators/A&P                            Patient presents with dental pain. Patient is nontoxic appearing, vitals without significant abnormality. No gross abscess.  Exam unconcerning for Ludwig's angina or deep space infection at this time.  Will treat with Augmentin and Naproxen.  Urged patient to follow-up with dentist, dental resources were provided.  Discussed treatment plan and need for follow up as well as return precautions. Provided opportunity for questions, patient confirmed understanding and is agreeable to plan.   Final Clinical Impression(s) / ED Diagnoses Final diagnoses:  Pain, dental    Rx / DC Orders ED Discharge Orders          Ordered    amoxicillin-clavulanate (AUGMENTIN) 875-125 MG tablet  Every 12 hours        08/19/21 0157    naproxen (NAPROSYN) 500 MG tablet  2 times daily PRN        08/19/21 0157    lidocaine (XYLOCAINE) 2 % solution  As needed         08/19/21 0157             Cherly Anderson, PA-C 08/19/21 0158    Tilden Fossa, MD 08/21/21 [phone]

## 2021-08-19 NOTE — ED Notes (Signed)
Pt discharged. Instructions and prescriptions given. AAOX4. Pt in no apparent distress with moderate pain. The opportunity to ask questions was provided.  

## 2021-08-19 NOTE — ED Triage Notes (Signed)
Patient c/o right upper dental pain x 2 days. Patient states he was seen yesterday and the swelling in his face has decreased.

## 2021-09-26 NOTE — Progress Notes (Signed)
° ° ° °  SUBJECTIVE:   CHIEF COMPLAINT / HPI:   Adam Guzman is a 26 y.o. male presents for lesion on face  Bump on jawline Patient reports he has noticed a lesion on his left jaw which has been presents for a few months. It has not got bigger. It is not painful to touch. Patient shaves his face and has noticed other small bumps similar to this as well.  Patient has a history of neurofibromatosis but does not report any recent skin conditions related to this.  Flowsheet Row Office Visit from 09/27/2021 in [facility]  PHQ-9 Total Score 4        PERTINENT  PMH / PSH: NF1, migraine  OBJECTIVE:   BP 132/76    Pulse 89    Wt 152 lb 9.6 oz (69.2 kg)    SpO2 100%    BMI 20.70 kg/m    General: Alert, no acute distress Cardio: Well-perfused Pulm: normal work of breathing Neuro: Cranial nerves grossly intact  Skin exam as below-2 cm, non-mobile, nontender, firm papule over left jaw line. Multiple other small, similar papular noted on pt's neck and jaw line.      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 The area does not look infected.  Recommended patient avoids shaving and uses other tools to trim beard such as scissors. Also recommended benzyl peroxide to apply to areas daily.  Follow-up with PCP if persistent symptoms.  Would consider topical steroids if no improvement.  Patient expressed understanding and is happy with the plan.    Towanda Octave, MD PGY-3 [facility]

## 2021-09-27 ENCOUNTER — Other Ambulatory Visit: Payer: Self-pay

## 2021-09-27 ENCOUNTER — Ambulatory Visit (INDEPENDENT_AMBULATORY_CARE_PROVIDER_SITE_OTHER): Payer: Self-pay | Admitting: Family Medicine

## 2021-09-27 DIAGNOSIS — L731 Pseudofolliculitis barbae: Secondary | ICD-10-CM

## 2021-09-27 DIAGNOSIS — L738 Other specified follicular disorders: Secondary | ICD-10-CM

## 2021-09-27 MED ORDER — BENZOYL PEROXIDE 2.5 % EX CREA: 1.0000 "application " | TOPICAL_CREAM | Freq: Two times a day (BID) | CUTANEOUS | 0 refills | Status: AC

## 2021-09-27 MED ORDER — BENZOYL PEROXIDE-ERYTHROMYCIN 5-3 % EX GEL: Freq: Two times a day (BID) | CUTANEOUS | 0 refills | Status: DC

## 2021-09-27 NOTE — Patient Instructions (Signed)
Thank you for coming to see me today. It was a pleasure. Today we discussed your skin bump-it is called pseudofolliculitis barbae. I recommend twice daily benzoyl peroxide twice a day and avoid shaving. If it becomes inflamed, tender, red etc come back and see [place].   Please follow-up with PCP as needed.  If you have any questions or concerns, please do not hesitate to call the office at [phone].  Best wishes,   Dr Allena Katz

## 2021-09-29 DIAGNOSIS — L738 Other specified follicular disorders: Secondary | ICD-10-CM | POA: Insufficient documentation

## 2021-09-29 DIAGNOSIS — L731 Pseudofolliculitis barbae: Secondary | ICD-10-CM | POA: Insufficient documentation

## 2021-09-29 NOTE — Assessment & Plan Note (Signed)
Likely pseudofolliculitis barbae AKA "razor bumps" secondary to shaving. The area does not look infected.  Recommended patient avoids shaving and uses other tools to trim beard such as scissors. Also recommended benzyl peroxide to apply to areas daily.  Follow-up with PCP if persistent symptoms.  Would consider topical steroids if no improvement.  Patient expressed understanding and is happy with the plan.

## 2021-09-29 NOTE — Assessment & Plan Note (Deleted)
Likely folliculitis barbae secondary to shaving.  The area does not look infected.  Recommended patient avoids shaving and uses other tools to trim beard such as scissors. Also recommended benzyl peroxide to apply to areas daily.  Follow-up with PCP if persistent symptoms.  Would consider topical steroids if no improvement.  Patient expressed understanding and is happy with the plan.

## 2021-11-03 ENCOUNTER — Encounter (HOSPITAL_COMMUNITY): Payer: Self-pay | Admitting: Emergency Medicine

## 2021-11-03 ENCOUNTER — Ambulatory Visit (HOSPITAL_COMMUNITY)
Admission: EM | Admit: 2021-11-03 | Discharge: 2021-11-03 | Disposition: A | Discharge status: Home / Self Care | Payer: Medicaid Other | Attending: Family Medicine | Admitting: Family Medicine

## 2021-11-03 ENCOUNTER — Other Ambulatory Visit: Payer: Self-pay

## 2021-11-03 DIAGNOSIS — J069 Acute upper respiratory infection, unspecified: Secondary | ICD-10-CM | POA: Insufficient documentation

## 2021-11-03 DIAGNOSIS — R059 Cough, unspecified: Secondary | ICD-10-CM | POA: Insufficient documentation

## 2021-11-03 DIAGNOSIS — Z20822 Contact with and (suspected) exposure to covid-19: Secondary | ICD-10-CM | POA: Insufficient documentation

## 2021-11-03 LAB — SARS CORONAVIRUS 2 (TAT 6-24 HRS): SARS Coronavirus 2: NEGATIVE

## 2021-11-03 MED ORDER — MONTELUKAST SODIUM 10 MG PO TABS: 10.0000 mg | ORAL_TABLET | Freq: Every day | ORAL | 0 refills | Status: DC

## 2021-11-03 NOTE — ED Triage Notes (Signed)
Cough and congestion for 2-3 days.  ?

## 2021-11-03 NOTE — Discharge Instructions (Addendum)
Your symptoms sound most consistent with a viral illness, likely adenovirus. ?Supportive care is the mainstay of treatment - rest, hydration, frequent hand washing, tylenol or ibuprofen as needed for fever or aches.  ?We will call you with the results of your covid test once received. ?You may start montelukast once nightly to prevent pulmonary issues. ?You may also take cetirizine once daily in the morning. ?

## 2021-11-03 NOTE — ED Provider Notes (Signed)
?[facility] ? ? ? ?CSN: [id number] ?Arrival date & time: 11/03/21  0846 ? ? ?  ? ?History   ?Chief Complaint ?Chief Complaint  ?Patient presents with  ? Cough  ? Nasal Congestion  ? ? ?HPI ?Adam Guzman is a 26 y.o. male.  ? ?25yo male presents today with concerns of cough and nasal congestion. He reports sx started out very mild on Monday primarily with nasal congestion. He states it progressed to worsening nasal and chest congestion on Wednesday with the worst of his symptoms being yesterday. He states he felt "hot" but did not take his temp with a thermometer. He has been taking OTC robitussin with some resolution to his symptoms. He denies hx of asthma but states "catches lung stuff" easily. He does have a dry cough with intermittent sputum production of clear/ white phlegm. No blood. No headache, ear pain, sinus pain, sore throat, rash, body aches, GI sx or any additional sx. He is requesting a covid test. He is fully vaccinated, has had covid in the past, but >1 year ago. ? ? ?Cough ?Associated symptoms: rhinorrhea   ? ?Past Medical History:  ?Diagnosis Date  ? Neurofibromatosis (HCC)   ? ? ?Patient Active Problem List  ? Diagnosis Date Noted  ? 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 Migraine variant with headache 10/08/2018  ? Poor sleep hygiene 01/02/2018  ? Pain in joint, shoulder region 03/20/2017  ? Migraine without aura and without status migrainosus, not intractable 01/11/2016  ? Neck pain on right side 07/13/2015  ? Delayed milestones 12/10/2013  ? 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 Problems with learning 12/10/2013  ? GYNECOMASTIA, UNILATERAL 09/02/2007  ? ECZEMA, ATOPIC DERMATITIS 10/24/2006  ? ? ?Past Surgical History:  ?Procedure Laterality Date  ? WISDOM TOOTH EXTRACTION    ? ? ? ? ? ?Home Medications   ? ?Prior to Admission medications   ?Medication Sig Start Date End Date Taking? Authorizing Provider  ?montelukast (SINGULAIR) 10 MG tablet  Take 1 tablet (10 mg total) by mouth at bedtime. 11/03/21 12/03/21 Yes Tierre Gerard L, PA  ?cetirizine (ZYRTEC) 10 MG tablet Take 1 tablet (10 mg total) by mouth daily. ?Patient not taking: Reported on 05/20/2019 05/01/19 05/23/20  [name], NP  ? ? ?Family History ?Family History  ?Problem Relation Age of Onset  ? Cancer Father   ?     Died at 1  ? Healthy Mother   ? ? ?Social History ?Social History  ? ?Tobacco Use  ? Smoking status: Every Day  ?  Packs/day: 0.20  ?  Types: Cigarettes  ? Smokeless tobacco: Never  ?Vaping Use  ? Vaping Use: Some days  ? Substances: Nicotine, Flavoring  ?Substance Use Topics  ? Alcohol use: Yes  ?  Alcohol/week: 0.0 standard drinks  ?  Comment: socially  ? Drug use: No  ? ? ? ?Allergies   ?Patient has no known allergies. ? ? ?Review of Systems ?Review of Systems  ?HENT:  Positive for congestion and rhinorrhea.   ?Respiratory:  Positive for cough.   ?All other systems reviewed and are negative. ? ? ?Physical Exam ?Triage Vital Signs ?ED Triage Vitals  ?Enc Vitals Group  ?   BP 11/03/21 0907 127/77  ?   Pulse Rate 11/03/21 0907 90  ?   Resp 11/03/21 0907 16  ?   Temp 11/03/21 0907 98.2 ?F (36.8 ?C)  ?   Temp Source 11/03/21 0907 Oral  ?  SpO2 11/03/21 0907 97 %  ?   Weight --   ?   Height --   ?   Head Circumference --   ?   Peak Flow --   ?   Pain Score 11/03/21 0905 0  ?   Pain Loc --   ?   Pain Edu? --   ?   Excl. in GC? --   ? ?No data found. ? ?Updated Vital Signs ?BP 127/77   Pulse 90   Temp 98.2 ?F (36.8 ?C) (Oral)   Resp 16   SpO2 97%  ? ?Visual Acuity ?Right Eye Distance:   ?Left Eye Distance:   ?Bilateral Distance:   ? ?Right Eye Near:   ?Left Eye Near:    ?Bilateral Near:    ? ?Physical Exam ?Vitals and nursing note reviewed.  ?Constitutional:   ?   General: He is not in acute distress. ?   Appearance: Normal appearance. He is well-developed and normal weight. He is not ill-appearing, toxic-appearing or diaphoretic.  ?HENT:  ?   Head: Normocephalic and atraumatic.   ?   Right Ear: Tympanic membrane, ear canal and external ear normal. There is no impacted cerumen.  ?   Left Ear: Tympanic membrane, ear canal and external ear normal.  ?   Nose: Nose normal. No rhinorrhea.  ?   Mouth/Throat:  ?   Mouth: Mucous membranes are moist.  ?   Pharynx: Oropharynx is clear. No oropharyngeal exudate or posterior oropharyngeal erythema.  ?Eyes:  ?   General: No scleral icterus.    ?   Right eye: No discharge.     ?   Left eye: No discharge.  ?   Extraocular Movements: Extraocular movements intact.  ?   Conjunctiva/sclera: Conjunctivae normal.  ?   Pupils: Pupils are equal, round, and reactive to light.  ?Cardiovascular:  ?   Rate and Rhythm: Normal rate and regular rhythm.  ?   Pulses: Normal pulses.  ?   Heart sounds: Normal heart sounds. No murmur heard. ?Pulmonary:  ?   Effort: Pulmonary effort is normal. No respiratory distress.  ?   Breath sounds: Normal breath sounds. No stridor. No wheezing, rhonchi or rales.  ?Chest:  ?   Chest wall: No tenderness.  ?Abdominal:  ?   Palpations: Abdomen is soft.  ?   Tenderness: There is no abdominal tenderness.  ?Musculoskeletal:     ?   General: No swelling. Normal range of motion.  ?   Cervical back: Normal range of motion and neck supple. No rigidity or tenderness.  ?   Right lower leg: No edema.  ?   Left lower leg: No edema.  ?Lymphadenopathy:  ?   Cervical: No cervical adenopathy.  ?Skin: ?   General: Skin is warm and dry.  ?   Capillary Refill: Capillary refill takes less than 2 seconds.  ?   Coloration: Skin is not jaundiced.  ?   Findings: No erythema.  ?Neurological:  ?   General: No focal deficit present.  ?   Mental Status: He is alert and oriented to person, place, and time.  ?   Motor: No weakness.  ?Psychiatric:     ?   Mood and Affect: Mood normal.  ? ? ? ?UC Treatments / Results  ?Labs ?(all labs ordered are listed, but only abnormal results are displayed) ?Labs Reviewed  ?SARS CORONAVIRUS 2 (TAT 6-24 HRS)   ? ? ?EKG ? ? ?Radiology ?No results found. ? ?  Procedures ?Procedures (including critical care time) ? ?Medications Ordered in UC ?Medications - No data to display ? ?Initial Impression / Assessment and Plan / UC Course  ?I have reviewed the triage vital signs and the nursing notes. ? ?Pertinent labs & imaging results that were available during my care of the patient were reviewed by me and considered in my medical decision making (see chart for details). ? ?  ? ?Viral URI with cough - covid test performed per pt request, however if positive, pt understands he will be past the window of treatment at that time. Viral URI vs allergies. Will start montelukast given pt's hx of lung issues in the past however VSS and lung exam benign today. Supportive measures discussed with pt including rest, water intake, OTC meds as needed. ? ?Final Clinical Impressions(s) / UC Diagnoses  ? ?Final diagnoses:  ?Viral URI with cough  ? ? ? ?Discharge Instructions   ? ?  ?Your symptoms sound most consistent with a viral illness, likely adenovirus. ?Supportive care is the mainstay of treatment - rest, hydration, frequent hand washing, tylenol or ibuprofen as needed for fever or aches.  ?We will call you with the results of your covid test once received. ?You may start montelukast once nightly to prevent pulmonary issues. ?You may also take cetirizine once daily in the morning. ? ? ? ? ?ED Prescriptions   ? ? Medication Sig Dispense Auth. Provider  ? montelukast (SINGULAIR) 10 MG tablet Take 1 tablet (10 mg total) by mouth at bedtime. 30 tablet [place], [place] L, PA  ? ?  ? ?PDMP not reviewed this encounter. ?  Maretta Bees, [place] ?11/03/21 1037 ? ?

## 2022-01-25 ENCOUNTER — Ambulatory Visit (HOSPITAL_COMMUNITY)
Admission: EM | Admit: 2022-01-25 | Discharge: 2022-01-25 | Disposition: A | Discharge status: Home / Self Care | Payer: Medicaid Other | Attending: Physician Assistant | Admitting: Physician Assistant

## 2022-01-25 ENCOUNTER — Encounter (HOSPITAL_COMMUNITY): Payer: Self-pay

## 2022-01-25 DIAGNOSIS — J029 Acute pharyngitis, unspecified: Secondary | ICD-10-CM | POA: Insufficient documentation

## 2022-01-25 DIAGNOSIS — K5289 Other specified noninfective gastroenteritis and colitis: Secondary | ICD-10-CM | POA: Insufficient documentation

## 2022-01-25 DIAGNOSIS — J069 Acute upper respiratory infection, unspecified: Secondary | ICD-10-CM | POA: Insufficient documentation

## 2022-01-25 DIAGNOSIS — R110 Nausea: Secondary | ICD-10-CM | POA: Insufficient documentation

## 2022-01-25 LAB — POCT RAPID STREP A, ED / UC: Streptococcus, Group A Screen (Direct): NEGATIVE

## 2022-01-25 MED ORDER — PROMETHAZINE HCL 25 MG PO TABS: 25.0000 mg | ORAL_TABLET | Freq: Four times a day (QID) | ORAL | 0 refills | Status: DC | PRN

## 2022-01-25 MED ORDER — ONDANSETRON 4 MG PO TBDP
4.0000 mg | ORAL_TABLET | Freq: Once | ORAL | Status: AC
  Administered 2022-01-25: 4 mg via ORAL

## 2022-01-25 MED ORDER — ONDANSETRON 4 MG PO TBDP
ORAL_TABLET | ORAL | Status: AC
  Filled 2022-01-25: qty 1

## 2022-01-25 NOTE — ED Provider Notes (Signed)
[facility]    CSN: [id number] Arrival date & time: 01/25/22  1237      History   Chief Complaint Chief Complaint  Patient presents with   Headache   Sore Throat    HPI Adam Guzman is a 26 y.o. male.   26 year old male presents with abdominal cramping, nausea and headache.  Patient relates he has a history of having migraine headaches, these are intermittent, and different levels of severity.  Patient relates he has had this headache for the past day, occipital location, dull, with mild photophobia.  Patient relates he started yesterday with a mild sore throat, that is really just scratchy, he has not had any fever or chills.  Patient is having some abdominal cramping with associated nausea, without vomiting.  Patient relates he is not have any diarrhea.  Patient denies chest congestion or cough.  Patient relates he has not been around any friends that have been sick, and he also has not eaten any foods that did not taste right. Patient relates he is not on any migraine preventative medicine at the present time, and he does not take any medicines when he has the migraine.  Patient relates he treats the migraine with just increasing his fluid intake and sits in a quiet environment for a while until the headache resolves.  Does not have any vision changes, no numbness or tingling of the upper or lower extremities, no balance problems.   Headache Associated symptoms: abdominal pain (cramping) and nausea   Sore Throat Associated symptoms include abdominal pain (cramping) and headaches.   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 Authorizing Provider  montelukast (SINGULAIR) 10 MG tablet Take 1 tablet (10 mg total) by mouth at bedtime. 11/03/21 12/03/21  Crain, Alphonzo Lemmings L, PA  promethazine (PHENERGAN) 25 MG tablet Take 1 tablet (25 mg total) by mouth every 6 (six) hours as needed for nausea or vomiting. 01/25/22  Yes Ellsworth Lennox, PA-C  cetirizine (ZYRTEC) 10 MG tablet Take 1 tablet (10 mg total) by mouth daily. Patient not taking: Reported on 05/20/2019 05/01/19 05/23/20  [name], NP    Family History Family History  Problem Relation Age of Onset   Cancer Father        Died at 47   Healthy Mother     Social History Social History   Tobacco Use   Smoking status: Every Day    Packs/day: 0.20    Types: Cigarettes   Smokeless tobacco: Never  Vaping Use   Vaping Use: Some days   Substances: Nicotine, Flavoring  Substance Use Topics   Alcohol use: Yes    Alcohol/week: 0.0 standard drinks    Comment: socially  Drug use: No     Allergies   Patient has no known allergies.   Review of Systems Review of Systems  Gastrointestinal:  Positive for abdominal pain (cramping) and nausea.  Neurological:  Positive for headaches.    Physical Exam Triage Vital Signs ED Triage Vitals  Enc Vitals Group     BP 01/25/22 1353 112/76     Pulse Rate 01/25/22 1354 89     Resp 01/25/22 1353 16     Temp 01/25/22 1354 98.2 F (36.8 C)     Temp Source 01/25/22 1354 Oral     SpO2 01/25/22 1353 100 %     Weight --      Height --      Head Circumference --      Peak Flow --      Pain  Score --      Pain Loc --      Pain Edu? --      Excl. in GC? --    No data found.  Updated Vital Signs BP 112/76 (BP Location: Left Arm)   Pulse 89   Temp 98.2 F (36.8 C) (Oral)   Resp 16   SpO2 100%   Visual Acuity Right Eye Distance:   Left Eye Distance:   Bilateral Distance:    Right Eye Near:   Left Eye Near:    Bilateral Near:     Physical Exam Constitutional:      Appearance: He is well-developed.  HENT:     Right Ear: Tympanic membrane and ear canal normal.     Left Ear: Tympanic membrane and ear canal normal.     Mouth/Throat:     Mouth: Mucous membranes are moist.     Pharynx: Oropharynx is clear.  Cardiovascular:     Rate and Rhythm: Normal rate and regular rhythm.     Heart sounds: Normal heart sounds.  Pulmonary:     Effort: Pulmonary effort is normal.     Breath sounds: Normal breath sounds. No wheezing or rhonchi.  Lymphadenopathy:     Cervical: No cervical adenopathy.  Neurological:     General: No focal deficit present.     Mental Status: He is alert.     Cranial Nerves: Cranial nerves 2-12 are intact.     Coordination: Coordination is intact.     UC Treatments / Results  Labs (all labs ordered are listed, but only abnormal results are displayed) Labs Reviewed  POCT RAPID STREP A, ED / UC    EKG   Radiology No results found.  Procedures Procedures (including critical care time)  Medications Ordered in UC Medications  ondansetron (ZOFRAN-ODT) disintegrating tablet 4 mg (has no administration in time range)    Initial Impression / Assessment and Plan / UC Course  I have reviewed the triage vital signs and the nursing notes.  Pertinent labs & imaging results that were available during my care of the patient were reviewed by me and considered in my medical decision making (see chart for details).    Plan: 1.  Advised patient to take the Phenergan 25 mg 1 every 6 hours as he needs it to control the nausea and abdominal  cramping. 2.  Patient is advised to drink clear liquids, bland diet, for the next 48 hours until symptoms improve. 3.  Patient advised to follow-up with PCP or return if symptoms fail to improve. 4.  Throat culture is pending. Final Clinical Impressions(s) / UC Diagnoses   Final diagnoses:  Viral upper respiratory tract infection  Nausea without vomiting  Other noninfectious gastroenteritis     Discharge Instructions      Advised clear liquids and bland diet over the next 48 hours. Advised to take the Phenergan 25 mg 1 every 6 hours as needed for the nausea and stomach cramping. Advised to follow-up with PCP or urgent care if symptoms fail to improve within the next 3 to 5 days.    ED Prescriptions     Medication Sig Dispense Auth. Provider   promethazine (PHENERGAN) 25 MG tablet Take 1 tablet (25 mg total) by mouth every 6 (six) hours as needed for nausea or vomiting. 30 tablet Ellsworth Lennox, PA-C      PDMP not reviewed this encounter.   Ellsworth Lennox, PA-C 01/25/22 1434

## 2022-01-25 NOTE — ED Triage Notes (Signed)
C/o sore throat and headache x 2-3 days. Pt reports severe migraine in the past.

## 2022-01-25 NOTE — Progress Notes (Deleted)
    SUBJECTIVE:   CHIEF COMPLAINT / HPI:   ***  PERTINENT  PMH / PSH:  Neurofibromatosis Type 1 Episodic tension headaches  OBJECTIVE:   There were no vitals taken for this visit.   General: Alert, no acute distress Cardio: Normal S1 and S2, RRR, no r/m/g Pulm: CTAB, normal work of breathing Abdomen: Bowel sounds normal. Abdomen soft and non-tender.  Extremities: No peripheral edema.  Neuro: Cranial nerves grossly intact   ASSESSMENT/PLAN:   No problem-specific Assessment & Plan notes found for this encounter.     Dana Allan, MD [facility]

## 2022-01-25 NOTE — Discharge Instructions (Addendum)
Advised clear liquids and bland diet over the next 48 hours. Advised to take the Phenergan 25 mg 1 every 6 hours as needed for the nausea and stomach cramping. Advised to follow-up with PCP or urgent care if symptoms fail to improve within the next 3 to 5 days.

## 2022-01-25 NOTE — Patient Instructions (Incomplete)
Thank you for coming to see me today. It was a pleasure.   Lesions are likely related to your Neurofibromatosis Follow up with Neurology  For your headaches Limit pain medication to 1-2 times a week.  Can take Tylenol 500 mg three times a day Get at least 8 hours of sleep a night Eat 3 meals a day and drink plenty of water to stay hydrated. Follow up with Neurology   Recommend HPV vaccine and Tetanus vaccine   Please follow-up with PCP as needed  If you have any questions or concerns, please do not hesitate to call the office at [phone].  Best,   Dana Allan, MD

## 2022-01-28 LAB — CULTURE, GROUP A STREP (THRC)

## 2022-01-29 ENCOUNTER — Ambulatory Visit: Payer: Self-pay | Admitting: Family Medicine

## 2022-01-30 ENCOUNTER — Encounter: Payer: Self-pay | Admitting: *Deleted

## 2022-02-11 ENCOUNTER — Other Ambulatory Visit: Payer: Self-pay

## 2022-02-11 ENCOUNTER — Encounter (HOSPITAL_COMMUNITY): Payer: Self-pay

## 2022-02-11 ENCOUNTER — Emergency Department (HOSPITAL_COMMUNITY)
Admission: EM | Admit: 2022-02-11 | Discharge: 2022-02-11 | Disposition: A | Discharge status: Home / Self Care | Payer: Medicaid Other | Attending: Emergency Medicine | Admitting: Emergency Medicine

## 2022-02-11 DIAGNOSIS — L720 Epidermal cyst: Secondary | ICD-10-CM | POA: Insufficient documentation

## 2022-02-11 DIAGNOSIS — H9202 Otalgia, left ear: Secondary | ICD-10-CM | POA: Insufficient documentation

## 2022-02-11 MED ORDER — OFLOXACIN 0.3 % OT SOLN: 10.0000 [drp] | Freq: Every day | OTIC | 0 refills | Status: AC

## 2022-02-11 NOTE — ED Triage Notes (Signed)
Patient states he has had a cyst in his left ear x 1 week.

## 2022-02-11 NOTE — ED Notes (Signed)
Left ear irrigated with a mixture of sterile water and hdyrogen peroxide.

## 2022-02-11 NOTE — ED Provider Notes (Signed)
[facility]-EMERGENCY DEPT Provider Note   CSN: [id number] Arrival date & time: 02/11/22  5176     History  Chief Complaint  Patient presents with   Cyst    Adam Guzman is a 26 y.o. male.  26 y.o male with no PMH presents to the ED with a chief complaint of left ear pain that is been ongoing for the past week.  Patient reports noticing a cyst to the left ear, this began to drain shortly after.  He reports severe tenderness along the left ear canal.  He has not taken any medication for improvement in symptoms.  No decrease in hearing, no headache, no other complaints.  The history is provided by the patient.       Home Medications Prior to Admission medications   Medication Sig Start Date End Date Taking? Authorizing Provider  ofloxacin (FLOXIN) 0.3 % OTIC solution Place 10 drops into the left ear daily for 7 days. 02/11/22 02/18/22 Yes Alamin Mccuiston, PA-C  montelukast (SINGULAIR) 10 MG tablet Take 1 tablet (10 mg total) by mouth at bedtime. 11/03/21 12/03/21  Crain, Alphonzo Lemmings L, PA  promethazine (PHENERGAN) 25 MG tablet Take 1 tablet (25 mg total) by mouth every 6 (six) hours as needed for nausea or vomiting. 01/25/22   Ellsworth Lennox, PA-C  cetirizine (ZYRTEC) 10 MG tablet Take 1 tablet (10 mg total) by mouth daily. Patient not taking: Reported on 05/20/2019 05/01/19 05/23/20  [name], NP      Allergies    Patient has no known allergies.    Review of Systems   Review of Systems  Constitutional:  Negative for fever.  HENT:  Positive for ear discharge and ear pain.   Neurological:  Negative for headaches.    Physical Exam Updated Vital Signs BP (!) 141/59 (BP Location: Left Arm)   Pulse 78   Temp 98.1 F (36.7 C) (Oral)   Resp 16   Ht 5\' 11"  (1.803 m)   Wt 67 kg   SpO2 100%   BMI 20.61 kg/m  Physical Exam Vitals and nursing note reviewed.  Constitutional:      Appearance: Normal appearance.  HENT:     Head: Normocephalic and atraumatic.      Mouth/Throat:     Mouth: Mucous membranes are moist.  Eyes:     Pupils: Pupils are equal, round, and reactive to light.  Pulmonary:     Effort: Pulmonary effort is normal.  Abdominal:     General: Abdomen is flat.  Musculoskeletal:     Cervical back: Normal range of motion and neck supple.  Skin:    General: Skin is warm and dry.  Neurological:     Mental Status: He is alert and oriented to person, place, and time.     ED Results / Procedures / Treatments   Labs (all labs ordered are listed, but only abnormal results are displayed) Labs Reviewed - No data to display  EKG None  Radiology No results found.  Procedures Procedures    Medications Ordered in ED Medications - No data to display  ED Course/ Medical Decision Making/ A&P                           Medical Decision Making    Presents to the ED with a chief complaint of left ear pain has been ongoing for the past week.  Also reports drainage from the left ear, states there  is tenderness to palpation along with a cyst present on the ear canal.  During visualization is very limited due to wax, large cyst at the end of the ear canal.  TM does not appear erythematous.  There is drainage from the ear canal.  No decrease in hearing.  No headaches, no  dizziness, no lightheadedness.  No pain along the mastoid area to suggest mastoiditis.  Patient does report headphone use with air pods.  Irrigated by nursing staff, had small amount of earwax removed.  I did discuss with him discontinuing earbuds for the next couple of days, continue with drops into the left ear.  We will need to follow-up with ENT if his symptoms do not improve.   Portions of this note were generated with Scientist, clinical (histocompatibility and immunogenetics). Dictation errors may occur despite best attempts at proofreading.   Final Clinical Impression(s) / ED Diagnoses Final diagnoses:  Left ear pain    Rx / DC Orders ED Discharge Orders          Ordered    ofloxacin  (FLOXIN) 0.3 % OTIC solution  Daily        02/11/22 1003              Claude Manges, PA-C 02/11/22 1007    Mancel Bale, MD 02/12/22 1414

## 2022-02-11 NOTE — Discharge Instructions (Signed)
You were given antibiotic drops to help treat your left external canal ear infection.  You may place 10 drops to the left ear daily for the next 7 days.  You will need to follow-up with the ear nose and throat doctors if the symptoms do not improve.

## 2022-04-05 ENCOUNTER — Emergency Department (HOSPITAL_COMMUNITY)
Admission: EM | Admit: 2022-04-05 | Discharge: 2022-04-05 | Discharge status: Against Medical Advice | Payer: Medicaid Other | Attending: Emergency Medicine | Admitting: Emergency Medicine

## 2022-04-05 ENCOUNTER — Other Ambulatory Visit: Payer: Self-pay

## 2022-04-05 ENCOUNTER — Encounter (HOSPITAL_COMMUNITY): Payer: Self-pay

## 2022-04-05 DIAGNOSIS — M545 Low back pain, unspecified: Secondary | ICD-10-CM | POA: Insufficient documentation

## 2022-04-05 DIAGNOSIS — Z5321 Procedure and treatment not carried out due to patient leaving prior to being seen by health care provider: Secondary | ICD-10-CM | POA: Insufficient documentation

## 2022-04-05 DIAGNOSIS — R519 Headache, unspecified: Secondary | ICD-10-CM | POA: Insufficient documentation

## 2022-04-05 NOTE — ED Provider Triage Note (Signed)
Emergency Medicine Provider Triage Evaluation Note  Adam Guzman , a 26 y.o. male  was evaluated in triage.  Pt complains of posterior headache and right-sided lower back pain.  Symptoms started 2 days ago.  No injuries, falls or trauma.  No nausea, vomiting, diarrhea.  No urinary symptoms.  States that he has had a migraine in the past, however this feels different.  No confusion or fevers.  Review of Systems  Positive: Headache, back pain Negative: Vomiting, urinary symptoms  Physical Exam  BP 117/77   Pulse 79   Temp 98.3 F (36.8 C) (Oral)   Resp 16   Ht 5\' 11"  (1.803 m)   Wt 68 kg   SpO2 100%   BMI 20.92 kg/m  Gen:   Awake, no distress   Resp:  Normal effort  MSK:   Moves extremities without difficulty  Other:  Tenderness over the right SI joint area.  Medical Decision Making  Medically screening exam initiated at 9:43 AM.  Appropriate orders placed.  Philemon L Mollica was informed that the remainder of the evaluation will be completed by another provider, this initial triage assessment does not replace that evaluation, and the importance of remaining in the ED until their evaluation is complete.     , PA-C 04/05/22 [phone]

## 2022-04-05 NOTE — ED Triage Notes (Signed)
Patient reports that he has had a headache and right lower back pain x 2 days. Patient states he had blurred vision in his left eye yesterday, but none today. Patient states no radiation of back pain down his legs.

## 2022-06-04 NOTE — Progress Notes (Deleted)
NEUROLOGY FOLLOW UP OFFICE NOTE  Adam Guzman [id number]  Assessment/Plan:   Neurofibromatosis type 1 Tension-type headache, not intractable   1  Will repeat MRI of brain with and without contrast 2  Limit use of pain relievers to no more than 2 days out of week to prevent risk of rebound or medication-overuse headache. 3  Follow up in one year or as needed.     Subjective:  Adam Guzman is a 26 year old male who follows up for neurofibromatosis and headaches.    UPDATE: MRI of brain was ordered last year but ***  HISTORY:  Patient has neurofibromatosis type 1.  He has been followed for many years by Dr. Wyline Copas, who has just recently retired.  He denies vision changes.  He has not had any surgeries removing neurofibromas, however it was once recommended to have the neurofibroma over left parieto-occipital scalp removed.  He declined as he was told that it may still recur.  More recently, he has been concerned about a small bump above his left eye for several months.  It was evaluated in Urgent Care back in July after it started draining pus and blood.  He was diagnosed with an infected sebaceous cyst which was treated with Bactrim and warm compresses.  Still with a bump but no longer draining.     He also has headache.  Right occipital moderate sharp pain associated with photophobia but no nausea, vomiting, phonophobia, or visual disturbance.  Lasts 20-30 minutes with ibuprofen or Tylenol.  Used to occur 3 days a week, but now once a week.    Uncomplicated birth via NSVD.  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   Imaging (personally reviewed) 01/09/2016 MRI BRAIN W WO:  Plexiform neurofibroma in the left parietal scalp, diminished basal ganglia signal on the right 08/06/2015 MRI C-SPINE:  Straightening of lordosis without spinal stenosis or nerve impingement, no cervical neurofibromas were identified, no enlargement of the dorsal root  ganglia 11/03/2008 MRI BRAIN W WO:  minimal T2 hyperintensity inferior to the anterior genu of the left internal capsule,.  No enhancement associated with this lesion.  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      PAST MEDICAL HISTORY: Past Medical History:  Diagnosis Date   Neurofibromatosis [facility])     MEDICATIONS: Current Outpatient Medications on File Prior to Visit  Medication Sig Dispense Refill   montelukast (SINGULAIR) 10 MG tablet Take 1 tablet (10 mg total) by mouth at bedtime. 30 tablet 0   promethazine (PHENERGAN) 25 MG tablet Take 1 tablet (25 mg total) by mouth every 6 (six) hours as needed for nausea or vomiting. 30 tablet 0   [DISCONTINUED] cetirizine (ZYRTEC) 10 MG tablet Take 1 tablet (10 mg total) by mouth daily. (Patient not taking: Reported on 05/20/2019) 30 tablet 0   No current facility-administered medications on file prior to visit.    ALLERGIES: No Known Allergies  FAMILY HISTORY: Family History  Problem Relation Age of Onset   Cancer Father        Died at 22   Healthy Mother       Objective:  *** General: No acute distress.  Patient appears ***-groomed.   Head:  Normocephalic/atraumatic Eyes:  Fundi examined but not visualized Neck: supple, no paraspinal tenderness, full range of motion Heart:  Regular rate and rhythm Back: No paraspinal tenderness Skin:  multiple cafe au lait spots and small subcutaneous neurofibromas on  the trunk and extremities.  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   Neurological Exam: alert and oriented to person, place, and time.  Speech fluent and not dysarthric, language intact.  CN II-XII intact. Bulk and tone normal, muscle strength 5/5 throughout.  Sensation to light touch intact.  Deep tendon reflexes 2+ throughout, toes downgoing.  Finger to nose testing intact.  Gait normal, Romberg negative.   Shon Millet, DO  CC:  Alfredo Martinez, MD

## 2022-06-06 ENCOUNTER — Encounter: Payer: Self-pay | Admitting: Neurology

## 2022-06-06 ENCOUNTER — Ambulatory Visit: Payer: Self-pay | Admitting: Neurology

## 2022-09-29 ENCOUNTER — Encounter (HOSPITAL_COMMUNITY): Payer: Self-pay | Admitting: *Deleted

## 2022-09-29 ENCOUNTER — Ambulatory Visit (HOSPITAL_COMMUNITY)
Admission: EM | Admit: 2022-09-29 | Discharge: 2022-09-29 | Disposition: A | Discharge status: Home / Self Care | Payer: Medicaid Other | Attending: Physician Assistant | Admitting: Physician Assistant

## 2022-09-29 DIAGNOSIS — S39012A Strain of muscle, fascia and tendon of lower back, initial encounter: Secondary | ICD-10-CM

## 2022-09-29 DIAGNOSIS — G44219 Episodic tension-type headache, not intractable: Secondary | ICD-10-CM

## 2022-09-29 DIAGNOSIS — M545 Low back pain, unspecified: Secondary | ICD-10-CM

## 2022-09-29 MED ORDER — TIZANIDINE HCL 4 MG PO TABS: 4.0000 mg | ORAL_TABLET | Freq: Four times a day (QID) | ORAL | 0 refills | Status: DC | PRN

## 2022-09-29 MED ORDER — NAPROXEN SODIUM 550 MG PO TABS: 550.0000 mg | ORAL_TABLET | Freq: Two times a day (BID) | ORAL | 1 refills | Status: DC

## 2022-09-29 NOTE — ED Provider Notes (Signed)
[place]    CSN: [id number] Arrival date & time: 09/29/22  1543      History   Chief Complaint Chief Complaint  Patient presents with   Back Pain   Headache    HPI Adam Guzman is a 27 y.o. male.   27 year old male presents with back pain and headaches.  Patient indicates for the past several months he has been having intermittent lower back pain that is worse when he bends, twists, or lifts.  He relates it is localized.  He does not have any weakness, numbness or tingling of lower extremities.  Patient indicates that he has been taking Tylenol and ibuprofen OTC that occasionally gives him relief.  Patient indicates he does have 2 separate jobs and both of these require him to lift heavy objects on a repetitive basis during the shifts.  He denies any trauma to the lower back. Patient also indicates that he has been having headaches, typically 1 every 3 to 4 weeks.  He relates headaches last about 10 minutes.  He does get some photophobia with the headaches but no nausea.  He indicates the headaches last about 10 minutes and sometimes he has to rest for the headache to resolve.  Patient indicates he does have a history of having migraine headaches in the past but they have improved considerably over the past several years.  He does not consider this headache and migraine type.  He indicates he does take Tylenol and Advil which does help relieve the headache. Patient indicates he does have a high caffeine intake and he also smokes on a regular basis.  He denies any fever or chills.   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   Migraine variant with headache 10/08/2018   Poor sleep hygiene 01/02/2018   Pain in joint, shoulder region 03/20/2017   Migraine without  aura and without status migrainosus, not intractable 01/11/2016   Neck pain on right side 07/13/2015   Delayed milestones 12/10/2013   Neurofibromatosis, type 1 (von Recklinghausen's disease) ([place]) 12/10/2013   Problems with learning 12/10/2013   GYNECOMASTIA, UNILATERAL 09/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 Authorizing Provider  naproxen sodium (ANAPROX DS) 550 MG tablet Take 1 tablet (550 mg total) by mouth 2 (two) times daily with a meal. 09/29/22  Yes Nyoka Lint, PA-C  tiZANidine (ZANAFLEX) 4 MG tablet Take 1 tablet (4 mg total) by mouth every 6 (six) hours as needed for muscle spasms. 09/29/22  Yes Nyoka Lint, PA-C  montelukast (SINGULAIR) 10 MG tablet Take 1 tablet (10 mg total) by mouth at bedtime. 11/03/21 12/03/21  Crain, Loree Fee L, PA  promethazine (PHENERGAN) 25 MG tablet Take 1 tablet (25 mg total) by mouth every 6 (six) hours as needed for nausea or vomiting. 01/25/22   Nyoka Lint, PA-C  cetirizine (ZYRTEC) 10 MG tablet Take 1 tablet (10 mg total) by mouth daily. Patient not taking: Reported on 05/20/2019 05/01/19 05/23/20  Zigmund Gottron, NP    Family History Family History  Problem Relation Age of  Onset   Cancer Father        Died at 56   Healthy Mother     Social History Social History   Tobacco Use   Smoking status: Every Day    Packs/day: 0.20    Types: Cigarettes   Smokeless tobacco: Never  Vaping Use   Vaping Use: Some days   Substances: Nicotine, Flavoring  Substance Use Topics   Alcohol use: Yes    Alcohol/week: 0.0 standard drinks of alcohol    Comment: socially   Drug use: No     Allergies   Patient has no known allergies.   Review of Systems Review of Systems  Musculoskeletal:  Positive for back pain.  Neurological:  Positive for headaches.      Physical Exam Triage Vital Signs ED Triage Vitals  Enc Vitals Group     BP 09/29/22 1719 133/73     Pulse Rate 09/29/22 1719 91     Resp 09/29/22 1719 18     Temp 09/29/22 1719 98.1 F (36.7 C)     Temp Source 09/29/22 1719 Oral     SpO2 09/29/22 1719 98 %     Weight --      Height --      Head Circumference --      Peak Flow --      Pain Score 09/29/22 1718 8     Pain Loc --      Pain Edu? --      Excl. in [place]? --    No data found.  Updated Vital Signs BP 133/73 (BP Location: Left Arm)   Pulse 91   Temp 98.1 F (36.7 C) (Oral)   Resp 18   SpO2 98%   Visual Acuity Right Eye Distance:   Left Eye Distance:   Bilateral Distance:    Right Eye Near:   Left Eye Near:    Bilateral Near:     Physical Exam Constitutional:      Appearance: He is well-developed.  HENT:     Right Ear: Tympanic membrane and ear canal normal.     Left Ear: Tympanic membrane and ear canal normal.     Mouth/Throat:     Mouth: Mucous membranes are moist.     Pharynx: Oropharynx is clear. Uvula midline.  Cardiovascular:     Rate and Rhythm: Normal rate and regular rhythm.     Heart sounds: Normal heart sounds.  Pulmonary:     Effort: Pulmonary effort is normal.     Breath sounds: Normal breath sounds and air entry. No wheezing, rhonchi or rales.  Musculoskeletal:       Back:     Comments: Back: Pain is palpated along the L3-S1 area midline and also paraspinous without any unusual swelling or redness.  Negative straight leg raise bilaterally, strength is intact bilaterally.  Range of motion is limited with pain on twisting and bending.  Lymphadenopathy:     Cervical: No cervical adenopathy.  Neurological:     Mental Status: He is alert.      UC Treatments / Results  Labs (all labs ordered are listed, but only abnormal results are displayed) Labs Reviewed - No data to display  EKG   Radiology No results found.  Procedures Procedures (including critical care  time)  Medications Ordered in UC Medications - No data to display  Initial Impression / Assessment and Plan / UC Course  I have reviewed the triage vital signs and the nursing notes.  Pertinent labs & imaging results that were available during my care of the patient were reviewed by me and considered in my medical decision making (see chart for details).    Plan: The diagnosis will be treated with the following: 1.  Low back pain without sciatica: A.  Anaprox DS every 12 hours with food to reduce pain. B.  Zanaflex 4 mg every 6 hours for muscle spasm. 2.  Migraine variant headache/tension. A.  Anaprox DS with Zanaflex for the headache. B.  Patient advised to reduce caffeine intake, reduce the stress, exercise outside of work. 3.  Patient advised follow-up PCP or return to urgent care if symptoms fail to improve. Final Clinical Impressions(s) / UC Diagnoses   Final diagnoses:  Acute midline low back pain without sciatica  Strain of lumbar region, initial encounter  Episodic tension-type headache, not intractable     Discharge Instructions      Advised take the Anaprox DS, 1 every 12 hours with food for back pain and headache. Advised to take Zanaflex 4 mg every 6 hours on a regular basis for muscle spasm. Advised follow-up PCP or return to urgent care if symptoms fail to improve.  Advised to reduce the amount of caffeine daily and also reduce nicotine intake.  Advised exercise outside of work to help reduce headaches.    ED Prescriptions     Medication Sig Dispense Auth. Provider   naproxen sodium (ANAPROX DS) 550 MG tablet Take 1 tablet (550 mg total) by mouth 2 (two) times daily with a meal. 30 tablet Nyoka Lint, PA-C   tiZANidine (ZANAFLEX) 4 MG tablet Take 1 tablet (4 mg total) by mouth every 6 (six) hours as needed for muscle spasms. 30 tablet Nyoka Lint, PA-C      PDMP not reviewed this encounter.   Nyoka Lint, PA-C 09/29/22 [phone]

## 2022-09-29 NOTE — Discharge Instructions (Signed)
Advised take the Anaprox DS, 1 every 12 hours with food for back pain and headache. Advised to take Zanaflex 4 mg every 6 hours on a regular basis for muscle spasm. Advised follow-up PCP or return to urgent care if symptoms fail to improve.  Advised to reduce the amount of caffeine daily and also reduce nicotine intake.  Advised exercise outside of work to help reduce headaches.

## 2022-09-29 NOTE — ED Triage Notes (Signed)
Pt states he has had lower back pain x months he works two jobs and is always on the go. He can't recall one specific thing that could have caused the pain. He does take tylenol and IBU for some relief when needed.   He states he is also having headaches on and off for months, he also takes tylenol and IBU when needed for these.   He does state he had to miss work today and will need a note.

## 2023-01-12 ENCOUNTER — Encounter (HOSPITAL_COMMUNITY): Payer: Self-pay

## 2023-01-12 ENCOUNTER — Ambulatory Visit (HOSPITAL_COMMUNITY)
Admission: EM | Admit: 2023-01-12 | Discharge: 2023-01-12 | Disposition: A | Discharge status: Home / Self Care | Payer: Self-pay | Attending: Emergency Medicine | Admitting: Emergency Medicine

## 2023-01-12 DIAGNOSIS — J069 Acute upper respiratory infection, unspecified: Secondary | ICD-10-CM

## 2023-01-12 MED ORDER — PROMETHAZINE-DM 6.25-15 MG/5ML PO SYRP: 5.0000 mL | ORAL_SOLUTION | Freq: Four times a day (QID) | ORAL | 0 refills | Status: DC | PRN

## 2023-01-12 NOTE — ED Triage Notes (Signed)
Patient here today with c/o body aches, cough, ST, runny nose, and HA since Tuesday. He tried taking Mucinex but not helping. He is having difficulty sleeping with the cough. He has also had fever and sweats. No sick contacts. Works at [employer]. No recent travel.

## 2023-01-12 NOTE — ED Provider Notes (Signed)
[facility]    CSN: [id number] Arrival date & time: 01/12/23  1128      History   Chief Complaint Chief Complaint  Patient presents with   Cough    HPI Adam Guzman is a 27 y.o. male.  4 day history of body aches, productive cough worse at night Some intermittent headache and scratchy throat. Denies fever or chills No abd pain, NVD. Eating and drinking normally  Has used mucinex   Denies sick contacts or recent travel. Not feeling short of breath or wheezing. No lung history. 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   Migraine variant with headache 10/08/2018   Poor sleep hygiene 01/02/2018   Pain in joint, shoulder region 03/20/2017   Migraine without aura and without status migrainosus, not intractable 01/11/2016   Neck pain on right side 07/13/2015   Delayed milestones 12/10/2013   Neurofibromatosis, type 1 (von Recklinghausen's disease) ([facility]) 12/10/2013   Problems with learning 12/10/2013   GYNECOMASTIA, UNILATERAL 09/02/2007   ECZEMA, ATOPIC DERMATITIS 10/24/2006    Past Surgical History:  Procedure Laterality Date   WISDOM TOOTH EXTRACTION       Home Medications    Prior to Admission medications   Medication Sig Start Date End Date Taking? Authorizing Provider  promethazine-dextromethorphan (PROMETHAZINE-DM) 6.25-15 MG/5ML syrup Take 5 mLs by mouth 4 (four) times daily as needed for cough. 01/12/23  Yes Williamson Cavanah, Lurena Joiner, PA-C  cetirizine (ZYRTEC) 10 MG tablet Take 1 tablet (10 mg total) by mouth daily. Patient not taking: Reported on 05/20/2019 05/01/19 05/23/20  [name], NP    Family History Family History  Problem Relation Age of Onset   Cancer Father        Died at 30   Healthy Mother     Social History Social History   Tobacco Use   Smoking status: Every Day    Packs/day: .2     Types: Cigarettes   Smokeless tobacco: Never  Vaping Use   Vaping Use: Some days   Substances: Nicotine, Flavoring  Substance Use Topics   Alcohol use: Yes    Alcohol/week: 0.0 standard drinks of alcohol    Comment: socially   Drug use: No     Allergies   Patient has no known allergies.   Review of Systems Review of Systems  As per HPI  Physical Exam Triage Vital Signs ED Triage Vitals  Enc Vitals Group     BP 01/12/23 1250 126/77     Pulse Rate 01/12/23 1250 78     Resp 01/12/23 1250 16     Temp 01/12/23 1250 98.6 F (37 C)     Temp Source 01/12/23 1250 Oral     SpO2 01/12/23 1250 99 %     Weight 01/12/23 1249 145 lb (65.8 kg)     Height 01/12/23 1249 5\' 11"  (1.803 m)     Head Circumference --      Peak Flow --      Pain Score 01/12/23 1249 7     Pain Loc --      Pain Edu? --      Excl. in [place]? --    No data found.  Updated Vital Signs BP 126/77 (BP Location: Left Arm)   Pulse 78   Temp 98.6 F (37 C) (Oral)   Resp 16  Ht 5\' 11"  (1.803 m)   Wt 145 lb (65.8 kg)   SpO2 99%   BMI 20.22 kg/m    Physical Exam Vitals and nursing note reviewed.  Constitutional:      General: He is not in acute distress. HENT:     Right Ear: Tympanic membrane and ear canal normal.     Left Ear: Tympanic membrane and ear canal normal.     Nose: No congestion or rhinorrhea.     Mouth/Throat:     Mouth: Mucous membranes are moist.     Pharynx: Oropharynx is clear. No posterior oropharyngeal erythema.  Eyes:     Conjunctiva/sclera: Conjunctivae normal.  Cardiovascular:     Rate and Rhythm: Normal rate and regular rhythm.     Pulses: Normal pulses.     Heart sounds: Normal heart sounds.  Pulmonary:     Effort: Pulmonary effort is normal.     Breath sounds: Normal breath sounds.     Comments: Clear throughout.  Dry cough infrequently in clinic Musculoskeletal:     Cervical back: Normal range of motion.  Lymphadenopathy:     Cervical: No cervical adenopathy.   Skin:    General: Skin is warm and dry.  Neurological:     Mental Status: He is alert and oriented to person, place, and time.      UC Treatments / Results  Labs (all labs ordered are listed, but only abnormal results are displayed) Labs Reviewed - No data to display  EKG   Radiology No results found.  Procedures Procedures (including critical care time)  Medications Ordered in UC Medications - No data to display  Initial Impression / Assessment and Plan / UC Course  I have reviewed the triage vital signs and the nursing notes.  Pertinent labs & imaging results that were available during my care of the patient were reviewed by me and considered in my medical decision making (see chart for details).  Afebrile, stable vitals.  Well-appearing and lungs are clear.  Discussed likely viral etiology of symptoms.  Recommend continuing symptomatic care at home.  He can try the Promethazine DM cough syrup 4 times daily if needed.  Also recommend using Tylenol or ibuprofen for aches.  Work note is provided.  Can return with any concerns or if symptoms are persisting or worsening.  No questions at this time  Final Clinical Impressions(s) / UC Diagnoses   Final diagnoses:  Viral URI with cough     Discharge Instructions      I recommend to try the cough syrup 4 times daily.  If this medicine makes you drowsy, take only before bedtime. You can continue other symptomatic care at home.  Tylenol or ibuprofen can be helpful for any aches.  Make sure you are drinking lots of fluids and staying hydrated.  Continue symptomatic care for the next several days and monitor for improvement.  If you feel persisting or worsening symptoms, you can return     ED Prescriptions     Medication Sig Dispense Auth. Provider   promethazine-dextromethorphan (PROMETHAZINE-DM) 6.25-15 MG/5ML syrup Take 5 mLs by mouth 4 (four) times daily as needed for cough. 240 mL Dinari Stgermaine, Lurena Joiner, PA-C      PDMP  not reviewed this encounter.   Marlow Baars, [place] 01/12/23 1437

## 2023-01-12 NOTE — Discharge Instructions (Addendum)
I recommend to try the cough syrup 4 times daily.  If this medicine makes you drowsy, take only before bedtime. You can continue other symptomatic care at home.  Tylenol or ibuprofen can be helpful for any aches.  Make sure you are drinking lots of fluids and staying hydrated.  Continue symptomatic care for the next several days and monitor for improvement.  If you feel persisting or worsening symptoms, you can return

## 2023-01-31 ENCOUNTER — Encounter (HOSPITAL_COMMUNITY): Payer: Self-pay

## 2023-01-31 ENCOUNTER — Ambulatory Visit (HOSPITAL_COMMUNITY)
Admission: EM | Admit: 2023-01-31 | Discharge: 2023-01-31 | Disposition: A | Discharge status: Home / Self Care | Payer: Self-pay | Attending: Emergency Medicine | Admitting: Emergency Medicine

## 2023-01-31 DIAGNOSIS — G8929 Other chronic pain: Secondary | ICD-10-CM

## 2023-01-31 DIAGNOSIS — R238 Other skin changes: Secondary | ICD-10-CM

## 2023-01-31 DIAGNOSIS — S46819A Strain of other muscles, fascia and tendons at shoulder and upper arm level, unspecified arm, initial encounter: Secondary | ICD-10-CM

## 2023-01-31 MED ORDER — DOXYCYCLINE HYCLATE 100 MG PO CAPS: 100.0000 mg | ORAL_CAPSULE | Freq: Two times a day (BID) | ORAL | 0 refills | Status: AC

## 2023-01-31 MED ORDER — NAPROXEN 500 MG PO TABS: 500.0000 mg | ORAL_TABLET | Freq: Two times a day (BID) | ORAL | 0 refills | Status: DC

## 2023-01-31 MED ORDER — CHLORHEXIDINE GLUCONATE 4 % EX SOLN: Freq: Every day | CUTANEOUS | 0 refills | Status: DC | PRN

## 2023-01-31 MED ORDER — TIZANIDINE HCL 4 MG PO TABS: 4.0000 mg | ORAL_TABLET | Freq: Three times a day (TID) | ORAL | 0 refills | Status: DC | PRN

## 2023-01-31 NOTE — ED Provider Notes (Signed)
HPI  SUBJECTIVE:  Adam Guzman is a 27 y.o. male who presents with 2 issues: First, he reports a known painful papule lateral to his right lower lip starting 3 to 4 days ago.  He squeezed it and expressed some pus.  He has not had any further drainage from it.  No aggravating or alleviating factors.  No surrounding erythema, edema, lip swelling or pain.  He denies rash elsewhere.  It was never a blister.  No preceding burning or tingling.  He is concerned about HSV.  Second, he also reports mid/upper back pain that has been present for months.  He works 2 very physical jobs, and states that the pain is worse with bending, torso rotation, lifting, picking up heavy objects, and better with rest.  He is not taking any medication for this.  He states this is the same pain that he was seen here for in February. He was sent home with Naprosyn, Zanaflex.  He does not remember if these medications were helpful or not.  He has a past medical history of neurofibromatosis, migraines, eczema.  No history of MRSA.  PCP: None.  Past Medical History:  Diagnosis Date   Neurofibromatosis [facility])     Past Surgical History:  Procedure Laterality Date   WISDOM TOOTH EXTRACTION      Family History  Problem Relation Age of Onset   Cancer Father        Died at 9   Healthy Mother     Social History   Tobacco Use   Smoking status: Every Day    Packs/day: .2    Types: Cigarettes   Smokeless tobacco: Never  Vaping Use   Vaping Use: Some days   Substances: Nicotine, Flavoring  Substance Use Topics   Alcohol use: Yes    Alcohol/week: 0.0 standard drinks of alcohol    Comment: socially   Drug use: No    No current facility-administered medications for this encounter.  Current Outpatient Medications:    chlorhexidine (HIBICLENS) 4 % external liquid, Apply topically daily as needed., Disp: 120 mL, Rfl: 0   doxycycline (VIBRAMYCIN) 100 MG capsule, Take 1 capsule (100 mg total) by mouth 2 (two) times  daily for 5 days., Disp: 10 capsule, Rfl: 0   naproxen (NAPROSYN) 500 MG tablet, Take 1 tablet (500 mg total) by mouth 2 (two) times daily., Disp: 20 tablet, Rfl: 0   tiZANidine (ZANAFLEX) 4 MG tablet, Take 1 tablet (4 mg total) by mouth every 8 (eight) hours as needed for muscle spasms., Disp: 30 tablet, Rfl: 0  No Known Allergies   ROS  As noted in HPI.   Physical Exam  BP 128/69 (BP Location: Left Arm)   Pulse 82   Temp 98.6 F (37 C) (Oral)   Resp 18   SpO2 99%   Constitutional: Well developed, well nourished, no acute distress Eyes:  EOMI, conjunctiva normal bilaterally HENT: Normocephalic, atraumatic,mucus membranes moist Respiratory: Normal inspiratory effort Cardiovascular: Normal rate GI: nondistended skin: nontender papule lateral to right lower lip with no expressible purulent drainage.  No surrounding erythema, induration.  Musculoskeletal: No C-spine, T-spine, L-spine tenderness.  Positive diffuse mid thoracic and trapezial tenderness with muscle spasm bilaterally.  Positive left paralumbar tenderness, spasm. Neurologic: Alert & oriented x 3, no focal neuro deficits Psychiatric: Speech and behavior appropriate   ED Course   Medications - No data to display  No orders of the defined types were placed in this encounter.   No results  found for this or any previous visit (from the past 24 hour(s)). No results found.  ED Clinical Impression  1. Skin pimple   2. Chronic bilateral thoracic back pain   3. Strain of trapezius muscle, unspecified laterality, initial encounter      ED Assessment/Plan     Previous records reviewed.  As noted in HPI.  1.   pimple.  No evidence of deep space infection.  Doubt HSV, which was patient's primary concern.  Was sent home with chlorhexidine soap, doxycycline for 5 days.   Staff to set patient up with community health and wellness center for routine care.  2.  Thoracic and trapezial strain-has been present for  months and is unchanged today.  This appears to be very musculoskeletal.  It is reproducible, and aggravated with movement.  Patient works two very physical jobs.  Will send home with Naprosyn/Tylenol, and try Zanaflex again as it is affordable with minimal side effects.  Patient declined work note.  Discussed MDM, treatment plan, and plan for follow-up with patient.  patient agrees with plan.   Meds ordered this encounter  Medications   tiZANidine (ZANAFLEX) 4 MG tablet    Sig: Take 1 tablet (4 mg total) by mouth every 8 (eight) hours as needed for muscle spasms.    Dispense:  30 tablet    Refill:  0   naproxen (NAPROSYN) 500 MG tablet    Sig: Take 1 tablet (500 mg total) by mouth 2 (two) times daily.    Dispense:  20 tablet    Refill:  0   doxycycline (VIBRAMYCIN) 100 MG capsule    Sig: Take 1 capsule (100 mg total) by mouth 2 (two) times daily for 5 days.    Dispense:  10 capsule    Refill:  0   chlorhexidine (HIBICLENS) 4 % external liquid    Sig: Apply topically daily as needed.    Dispense:  120 mL    Refill:  0      *This clinic note was created using Scientist, clinical (histocompatibility and immunogenetics). Therefore, there may be occasional mistakes despite careful proofreading.  ?    Domenick Gong, MD 01/31/23 2045

## 2023-01-31 NOTE — ED Triage Notes (Signed)
Pt c/o a pimple beside rt side of bottom lip x4-5 days, popped it but still there.

## 2023-01-31 NOTE — Discharge Instructions (Addendum)
Chlorhexidine soap and doxycycline for the pimple.  Try Naprosyn with 1000 mg of Tylenol twice a day and the Zanaflex, which is a muscle relaxant, for your back pain.  May take an additional 1000 mg of Tylenol per day.  Gentle stretching and massage can also be helpful.  Heat, warm soaks.

## 2023-02-05 ENCOUNTER — Ambulatory Visit (HOSPITAL_COMMUNITY)
Admission: EM | Admit: 2023-02-05 | Discharge: 2023-02-05 | Disposition: A | Discharge status: Home / Self Care | Payer: Medicaid Other | Attending: Emergency Medicine | Admitting: Emergency Medicine

## 2023-02-05 ENCOUNTER — Encounter (HOSPITAL_COMMUNITY): Payer: Self-pay | Admitting: *Deleted

## 2023-02-05 DIAGNOSIS — L700 Acne vulgaris: Secondary | ICD-10-CM

## 2023-02-05 MED ORDER — CLINDAMYCIN PHOSPHATE 1 % EX GEL: Freq: Every day | CUTANEOUS | 0 refills | Status: DC

## 2023-02-05 NOTE — ED Triage Notes (Signed)
Pt states he was here on 01/31/2023 and he finished the antibiotics but the spot isnt resolved.

## 2023-02-05 NOTE — Discharge Instructions (Signed)
Today you were evaluated for your persistent, does not appear to be abscess was a pocket of infection underneath the skin  You may apply clindamycin cream daily after cleansing with a nonscented soap and water  Then apply nonscented moisturizer to the skin as well as sunscreen  May follow-up with urgent care as needed, may also follow-up with dermatology for any other concerns, information is on front page

## 2023-02-06 NOTE — ED Provider Notes (Signed)
[facility]    CSN: [id number] Arrival date & time: 02/05/23  1646      History   Chief Complaint Chief Complaint  Patient presents with   Acne    HPI Adam Guzman is a 27 y.o. male.   Presents for evaluation of a persisting bump to the left side of the face present for 7 days.  Has had similar symptoms in the past.  Initially was able to pop the bump and expel puslike drainage but swelling did not improve after.  Was evaluated in this urgent care and prescribed doxycycline and chlorhexidine cleanser.  Endorses some improvement as the swelling has subsided but bump has not resolved.   Past Medical History:  Diagnosis Date   Neurofibromatosis 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 Authorizing Provider  clindamycin (CLINDAGEL) 1 % gel Apply topically daily. 02/05/23  Yes Tanessa Tidd R, NP  chlorhexidine (HIBICLENS) 4 % external liquid Apply topically daily as needed. 01/31/23   Domenick Gong, MD  naproxen (NAPROSYN) 500 MG tablet Take 1 tablet (500 mg total) by mouth 2 (two) times daily. 01/31/23   Domenick Gong, MD  tiZANidine (ZANAFLEX) 4 MG tablet Take 1 tablet (4 mg total) by mouth every 8 (eight) hours as needed for muscle spasms. 01/31/23    Domenick Gong, MD  cetirizine (ZYRTEC) 10 MG tablet Take 1 tablet (10 mg total) by mouth daily. Patient not taking: Reported on 05/20/2019 05/01/19 05/23/20  [name], NP    Family History Family History  Problem Relation Age of Onset   Cancer Father        Died at 91   Healthy Mother     Social History Social History   Tobacco Use   Smoking status: Every Day    Packs/day: .2    Types: Cigarettes   Smokeless tobacco: Never  Vaping Use   Vaping Use: Some days   Substances: Nicotine, Flavoring  Substance Use Topics   Alcohol use: Yes    Alcohol/week: 0.0 standard drinks of alcohol    Comment: socially   Drug use: No     Allergies   Patient has no known allergies.   Review of Systems Review of Systems   Physical Exam Triage Vital Signs ED Triage Vitals  Enc Vitals Group     BP 02/05/23 1724 116/82     Pulse Rate 02/05/23 1724 82     Resp 02/05/23 1724 18     Temp 02/05/23 1724 98.4 F (36.9 C)     Temp Source 02/05/23 1724 Oral     SpO2 02/05/23 1724 98 %     Weight --      Height --      Head Circumference --  Peak Flow --      Pain Score 02/05/23 1723 0     Pain Loc --      Pain Edu? --      Excl. in GC? --    No data found.  Updated Vital Signs BP 116/82 (BP Location: Left Arm)   Pulse 82   Temp 98.4 F (36.9 C) (Oral)   Resp 18   SpO2 98%   Visual Acuity Right Eye Distance:   Left Eye Distance:   Bilateral Distance:    Right Eye Near:   Left Eye Near:    Bilateral Near:     Physical Exam Constitutional:      Appearance: Normal appearance.  HENT:     Head:      Comments: Less than 0.5 cm macule present to the right cheek without erythema, swelling or drainage Eyes:     Extraocular Movements: Extraocular movements intact.  Pulmonary:     Effort: Pulmonary effort is normal.  Neurological:     Mental Status: He is alert and oriented to person, place, and time. Mental status is at baseline.      UC Treatments /  Results  Labs (all labs ordered are listed, but only abnormal results are displayed) Labs Reviewed - No data to display  EKG   Radiology No results found.  Procedures Procedures (including critical care time)  Medications Ordered in UC Medications - No data to display  Initial Impression / Assessment and Plan / UC Course  I have reviewed the triage vital signs and the nursing notes.  Pertinent labs & imaging results that were available during my care of the patient were reviewed by me and considered in my medical decision making (see chart for details).  Acne vulgaris  Lesion of concern has improved compared to picture taken on 01/31/2023 at initial evaluation, most consistent with acne lesion, discussed this with patient, advised daily cleansing and prescribed clindamycin cream, given walker referral to dermatology for further evaluation and management as needed Final Clinical Impressions(s) / UC Diagnoses   Final diagnoses:  Acne vulgaris     Discharge Instructions      Today you were evaluated for your persistent, does not appear to be abscess was a pocket of infection underneath the skin  You may apply clindamycin cream daily after cleansing with a nonscented soap and water  Then apply nonscented moisturizer to the skin as well as sunscreen  May follow-up with urgent care as needed, may also follow-up with dermatology for any other concerns, information is on front page   ED Prescriptions     Medication Sig Dispense Auth. Provider   clindamycin (CLINDAGEL) 1 % gel Apply topically daily. 30 g Valinda Hoar, NP      PDMP not reviewed this encounter.   Valinda Hoar, [place] 02/06/23 [phone]

## 2023-02-13 ENCOUNTER — Encounter (HOSPITAL_COMMUNITY): Payer: Self-pay

## 2023-02-13 ENCOUNTER — Ambulatory Visit (HOSPITAL_COMMUNITY)
Admission: EM | Admit: 2023-02-13 | Discharge: 2023-02-13 | Disposition: A | Discharge status: Home / Self Care | Payer: Self-pay | Attending: Emergency Medicine | Admitting: Emergency Medicine

## 2023-02-13 DIAGNOSIS — L700 Acne vulgaris: Secondary | ICD-10-CM

## 2023-02-13 MED ORDER — DOXYCYCLINE HYCLATE 100 MG PO CAPS: 100.0000 mg | ORAL_CAPSULE | Freq: Every day | ORAL | 0 refills | Status: AC

## 2023-02-13 NOTE — ED Triage Notes (Signed)
Patient here today with c/o small bumps on the right side of his face. He noticed them yesterday. No itching, No pain. He did state that he was able to pop one of them and pus came out. He was recently prescribed a gel last week for acne.

## 2023-02-13 NOTE — Discharge Instructions (Addendum)
We are trailing a short dose of doxycycline to see if this improves your nodular acne. Please use a face wash daily, I suggest 1 that has benzyl peroxide.  The brand CeraVe and Cetaphil are usually good choices. Please avoid touching your face throughout the day, as your hands carry oils that clog your pores.   I have attached information for [facility], please reach out to them to establish a primary care provider for further evaluation and management of your acne.

## 2023-02-13 NOTE — ED Provider Notes (Signed)
[facility]    CSN: [id number] Arrival date & time: 02/13/23  1556      History   Chief Complaint Chief Complaint  Patient presents with   Rash    HPI Adam Guzman is a 27 y.o. male.   Patient presents to clinic for complaints of small nodules to the side of his face that have been spreading since yesterday. He has been seen in clinic for acne since the beginning of June. Has been using topical Clindamycin lotion, this improved his acne to his left lip area, but these nodules appeared.  Reports he was able to pop one and pus came out.    He denies fever.     The history is provided by the patient and medical records.  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 Authorizing Provider  clindamycin (CLINDAGEL) 1 % gel Apply topically daily. 02/05/23  Yes White, Elita Boone, NP  doxycycline (VIBRAMYCIN) 100 MG capsule Take 1 capsule (100 mg total) by mouth daily for 20 days. 02/13/23 03/05/23 Yes Rinaldo Ratel, [place] N, FNP  cetirizine (ZYRTEC) 10 MG tablet Take 1 tablet (10 mg total) by mouth daily. Patient not taking: Reported on 05/20/2019 05/01/19 05/23/20  [name], NP    Family History Family History  Problem Relation Age of Onset   Cancer Father        Died at 54   Healthy Mother     Social History Social History   Tobacco Use   Smoking status: Every Day    Packs/day: .2    Types: Cigarettes   Smokeless tobacco: Never  Vaping Use   Vaping Use: Some days   Substances: Nicotine, Flavoring  Substance Use Topics   Alcohol use: Yes    Alcohol/week: 0.0 standard drinks of alcohol    Comment: socially   Drug use: No     Allergies   Patient has no known allergies.   Review of Systems Review of Systems  Constitutional:  Negative for fever.  Skin:  Negative for rash.     Physical Exam Triage Vital Signs ED Triage Vitals [02/13/23 1648]  Enc Vitals Group     BP 120/79     Pulse Rate 79     Resp 16     Temp 99.1 F (37.3 C)     Temp Source Oral     SpO2 97 %     Weight 145 lb (65.8 kg)     Height 5\' 11"  (1.803 m)     Head Circumference      Peak Flow      Pain Score 0     Pain Loc  Pain Edu?      Excl. in GC?    No data found.  Updated Vital Signs BP 120/79 (BP Location: Right Arm)   Pulse 79   Temp 99.1 F (37.3 C) (Oral)   Resp 16   Ht 5\' 11"  (1.803 m)   Wt 145 lb (65.8 kg)   SpO2 97%   BMI 20.22 kg/m   Visual Acuity Right Eye Distance:   Left Eye Distance:   Bilateral Distance:    Right Eye Near:   Left Eye Near:    Bilateral Near:     Physical Exam Vitals and nursing note reviewed.  Constitutional:      Appearance: Normal appearance.  HENT:     Head: Normocephalic and atraumatic.     Right Ear: External ear normal.     Left Ear: External ear normal.     Nose: Nose normal.     Mouth/Throat:     Mouth: Mucous membranes are moist.  Eyes:     Conjunctiva/sclera: Conjunctivae normal.  Cardiovascular:     Rate and Rhythm: Normal rate.  Pulmonary:     Effort: Pulmonary effort is normal. No respiratory distress.  Musculoskeletal:     Cervical back: Normal range of motion.   Skin:    General: Skin is warm and dry.     Findings: Acne present.     Comments: Nodular acne to right cheek and jaw.  Without tenderness, erythema or induration.   Neurological:     General: No focal deficit present.     Mental Status: He is alert.  Psychiatric:        Mood and Affect: Mood normal.        Behavior: Behavior is cooperative.      UC Treatments / Results  Labs (all labs ordered are listed, but only abnormal results are displayed) Labs Reviewed - No data to display  EKG   Radiology No results found.  Procedures Procedures (including critical care time)  Medications Ordered in UC Medications - No data to display  Initial Impression / Assessment and Plan / UC Course  I have reviewed the triage vital signs and the nursing notes.  Pertinent labs & imaging results that were available during my care of the patient were reviewed by me and considered in my medical decision making (see chart for details).  Vitals and triage reviewed, patient is hemodynamically stable.  Nodular acne to right cheek and jaw, will trial low dose doxycycline in case this is early folliculitis. Encouraged benzoyl peroxide face wash and moisturizer.  Given information for [facility] community health and wellness and encouraged to establish with PCP.  Patient verbalized understanding, no questions at this time.     Final Clinical Impressions(s) / UC Diagnoses   Final diagnoses:  Nodular acne     Discharge Instructions      We are trailing a short dose of doxycycline to see if this improves your nodular acne. Please use a face wash daily, I suggest 1 that has benzyl peroxide.  The brand CeraVe and Cetaphil are usually good choices. Please avoid touching your face throughout the day, as your hands carry oils that clog your pores.   I have attached information for [facility], please reach out to them to establish a primary care provider for further  evaluation and management of your acne.     ED Prescriptions     Medication Sig Dispense Auth. Provider   doxycycline (VIBRAMYCIN) 100  MG capsule Take 1 capsule (100 mg total) by mouth daily for 20 days. 20 capsule Mance Vallejo, [place] N, [place]      PDMP not reviewed this encounter.   Erielle Gawronski, [place] N, [place] 02/13/23 [phone]

## 2023-03-26 ENCOUNTER — Ambulatory Visit (INDEPENDENT_AMBULATORY_CARE_PROVIDER_SITE_OTHER): Payer: Self-pay | Admitting: Student

## 2023-03-26 VITALS — BP 110/80 | HR 86 | Temp 98.1°F | Ht 71.0 in | Wt 143.8 lb

## 2023-03-26 DIAGNOSIS — L739 Follicular disorder, unspecified: Secondary | ICD-10-CM | POA: Insufficient documentation

## 2023-03-26 DIAGNOSIS — R220 Localized swelling, mass and lump, head: Secondary | ICD-10-CM

## 2023-03-26 MED ORDER — MUPIROCIN 2 % EX OINT: 1.0000 | TOPICAL_OINTMENT | Freq: Three times a day (TID) | CUTANEOUS | 0 refills | Status: DC

## 2023-03-26 NOTE — Progress Notes (Signed)
  SUBJECTIVE:   CHIEF COMPLAINT / HPI:   Eye Concern: -Patient reports of a bump near the eye  -Initially reported that it had been present since around Saturday and had drained white drainage - Painful on Sunday but not as painful today - He does also note near the end of the visit that it has actually been intermittently present for the past year - He uses Cetaphil foam scrub daily   Jaw Nodule  -- Large nodule before right ear on mandible -- No fluctuance or drainage  -- No fever or chills  -- Reports of ringing in the ears      PERTINENT  PMH / PSH:   NF1  Patient Care Team: Alfredo Martinez, MD as PCP - General (Family Medicine) OBJECTIVE:  BP 110/80   Pulse 86   Temp 98.1 F (36.7 C)   Ht 5\' 11"  (1.803 m)   Wt 143 lb 12.8 oz (65.2 kg)   SpO2 99%   BMI 20.06 kg/m  Physical Exam  General: NAD, pleasant, able to participate in exam HEENT: Nodule over the mandible in front of the ear on the right, nonmobile, nonfluctuant, no erythema or drainage, TTP over area  Respiratory: No respiratory distress Skin: warm and dry, small fluctuant area with erythema, no drainage over the left eyebrow Psych: Normal affect and mood   ASSESSMENT/PLAN:  Folliculitis Assessment & Plan: No systemic symptoms, seems related to folliculitis and will provide mupirocin ointment to use in addition to benzyl peroxide over-the-counter.  Patient was a to do follow-up if area persisted or worsen. Does not seem similar to NF nodules on rest of body. Follow up in 4 weeks.   Orders: -     Mupirocin; Apply 1 Application topically 3 (three) times daily for 7 days.  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  Would recommend a brain MRI for further assessment, patient is without insurance and is unable to pay for an outpatient MRI.  We discussed that he should contact his job in order  to [employer] which she has worked up for 3 years. Will call me if unable to obtain insurance through work for assistance. Will order MRI when able to figure out insurance coverage.     Return in about 4 weeks (around 04/23/2023). Alfredo Martinez, MD 03/26/2023, 10:58 AM PGY-3, [facility]

## 2023-03-26 NOTE — Patient Instructions (Addendum)
It was great to see you today! Thank you for choosing [facility] for your primary care. Adam Guzman was seen for follow up.  Today we addressed: Mupirocin ointment three times a day for about 5 days  Can get over benzoyl peroxide cream to use daily as well Return if worsening as we discussed  Please call your work a call to discuss insurance and then we will order the MRI   If you haven't already, sign up for My Chart to have easy access to your labs results, and communication with your primary care physician.  I recommend that you always bring your medications to each appointment as this makes it easy to ensure you are on the correct medications and helps [place] not miss refills when you need them. Call the clinic at [phone] if your symptoms worsen or you have any concerns.  You should return to our clinic Return in about 4 weeks (around 04/23/2023). Please arrive 15 minutes before your appointment to ensure smooth check in process.  We appreciate your efforts in making this happen.  Thank you for allowing me to participate in your care, Alfredo Martinez, MD 03/26/2023, 9:11 AM PGY-3, [facility]

## 2023-03-26 NOTE — Assessment & Plan Note (Signed)
No systemic symptoms, seems related to folliculitis and will provide mupirocin ointment to use in addition to benzyl peroxide over-the-counter.  Patient was a to do follow-up if area persisted or worsen. Does not seem similar to NF nodules on rest of body. Follow up in 4 weeks.

## 2023-03-26 NOTE — Assessment & Plan Note (Signed)
Likely neurofibromatosis growth but possibly causing ringing in the ears by impinging on nerve.  Would recommend a brain MRI for further assessment, patient is without insurance and is unable to pay for an outpatient MRI.  We discussed that he should contact his job in order to [employer] which she has worked up for 3 years. Will call me if unable to obtain insurance through work for assistance. Will order MRI when able to figure out insurance coverage.

## 2023-03-28 ENCOUNTER — Ambulatory Visit (HOSPITAL_COMMUNITY)
Admission: EM | Admit: 2023-03-28 | Discharge: 2023-03-28 | Disposition: A | Discharge status: Home / Self Care | Payer: Self-pay | Attending: Family Medicine | Admitting: Family Medicine

## 2023-03-28 ENCOUNTER — Encounter (HOSPITAL_COMMUNITY): Payer: Self-pay

## 2023-03-28 DIAGNOSIS — L700 Acne vulgaris: Secondary | ICD-10-CM

## 2023-03-28 MED ORDER — MINOCYCLINE HCL 100 MG PO CAPS: 100.0000 mg | ORAL_CAPSULE | Freq: Two times a day (BID) | ORAL | 0 refills | Status: DC

## 2023-03-28 MED ORDER — TRETINOIN 0.1 % EX CREA: TOPICAL_CREAM | Freq: Every day | CUTANEOUS | 0 refills | Status: DC

## 2023-03-28 NOTE — ED Provider Notes (Signed)
[facility]   [id number] 03/28/23 Arrival Time: 1703  ASSESSMENT & PLAN:  1. Nodular acne    Explained that he needs to see a dermatologist. May require steroid injections.  Orders Placed This Encounter  Procedures   Ambulatory referral to Dermatology    Referral Priority:   Routine    Referral Type:   Consultation    Referral Reason:   Specialty Services Required    Requested Specialty:   Dermatology    Number of Visits Requested:   1   Trial of: Meds ordered this encounter  Medications   minocycline (MINOCIN) 100 MG capsule    Sig: Take 1 capsule (100 mg total) by mouth 2 (two) times daily.    Dispense:  28 capsule    Refill:  0   tretinoin (RETIN-A) 0.1 % cream    Sig: Apply topically at bedtime.    Dispense:  45 g    Refill:  0   May f/u here as needed. Reviewed expectations re: course of current medical issues. Questions answered. Outlined signs and symptoms indicating need for more acute intervention. Patient verbalized understanding. After Visit Summary given.   SUBJECTIVE:  Adam Guzman is a 27 y.o. male who presents with a skin complaint. Rash on the left side of the face. Was seen previously for the right side of the face. Onset on the left of the face a week ago. No new products, food, or medications. No one around the Patient with a similar rash.    OBJECTIVE: Vitals:   03/28/23 1827  BP: (!) 151/76  Pulse: 67  Resp: 18  Temp: 98.5 F (36.9 C)  TempSrc: Oral  SpO2: 99%  Weight: 64 kg  Height: 5\' 11"  (1.803 m)    General appearance: alert; no distress HEENT: [place]; AT Neck: supple with FROM Lungs: clear to auscultation bilaterally Heart: regular rate and rhythm Extremities: no edema; moves all extremities normally Skin: warm and dry; nodular acne on L face/jaw Psychological: alert and cooperative; normal mood and affect  No Known Allergies  Past Medical History:  Diagnosis Date   Neurofibromatosis (HCC)    Social History    Socioeconomic History   Marital status: Single    Spouse name: Not on file   Number of children: Not on file   Years of education: Not on file   Highest education level: Not on file  Occupational History   Not on file  Tobacco Use   Smoking status: Every Day    Current packs/day: 0.20    Types: Cigarettes   Smokeless tobacco: Never  Vaping Use   Vaping status: Some Days   Substances: Nicotine, Flavoring  Substance and Sexual Activity   Alcohol use: Yes    Alcohol/week: 0.0 standard drinks of alcohol    Comment: socially   Drug use: No   Sexual activity: Yes  Other Topics Concern   Not on file  Social History Narrative   Adam Guzman is a high school graduate.   He graduated from [employer].    He lives with his mother, brother, and step-dad.    He enjoys playing video games, riding his bike, watching TV, and parkour.   He is a Airline pilot at [employer] Strain: Not on [employer] Insecurity: Not on file  Transportation Needs: Not on file  Physical Activity: Not on file  Stress: Not on file  Social Connections: Not on file  Intimate Partner Violence: Not on file   Family History  Problem Relation Age of Onset   Cancer Father        Died at 81   Healthy Mother    Past Surgical History:  Procedure Laterality Date   WISDOM TOOTH EXTRACTION        Mardella Layman, MD 03/28/23 2039

## 2023-03-28 NOTE — ED Triage Notes (Signed)
Rash on the left side of the face. Was seen previously for the right side of the face. Onset on the left of the face a week ago. No new products, food, or medications. No one around the Patient with a similar rash.

## 2023-04-02 ENCOUNTER — Encounter (HOSPITAL_COMMUNITY): Payer: Self-pay

## 2023-04-02 ENCOUNTER — Ambulatory Visit (HOSPITAL_COMMUNITY)
Admission: RE | Admit: 2023-04-02 | Discharge: 2023-04-02 | Disposition: A | Discharge status: Home / Self Care | Payer: Self-pay | Source: Ambulatory Visit | Attending: Physician Assistant | Admitting: Physician Assistant

## 2023-04-02 VITALS — BP 144/77 | HR 85 | Temp 98.3°F | Resp 16

## 2023-04-02 DIAGNOSIS — L700 Acne vulgaris: Secondary | ICD-10-CM

## 2023-04-02 MED ORDER — CLINDAMYCIN PHOS-BENZOYL PEROX 1-5 % EX GEL: Freq: Two times a day (BID) | CUTANEOUS | 0 refills | Status: DC

## 2023-04-02 NOTE — ED Provider Notes (Signed)
[facility]    CSN: [id number] Arrival date & time: 04/02/23  1532      History   Chief Complaint Chief Complaint  Patient presents with   Mass    HPI Adam Guzman is a 27 y.o. male.   Patient presents today for evaluation of a small pustule on his right face.  He has been seen multiple times in the past few months for acne.  He was seen 01/31/2023 at which point he was given doxycycline and chlorhexidine.  He was seen again on 02/05/2023 and prescribed clindamycin gel.  Seen again on 02/13/2023 and given short course of Doxy as well as benzyl peroxide.  Symptoms temporarily improved but then he was seen again on 03/28/2023 and prescribed minocycline as well as topical retinoid.  He was unable to afford the topical retinoid so was not been using this but has been taking the minocycline.  Despite these medications he reports an additional lesion.  He denies any significant pain, swelling, fever, nausea, vomiting.  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  He has been referred to dermatology but has not yet scheduled an appointment.  He is not taking any over-the-counter medication for symptom management.    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 Authorizing Provider  clindamycin-benzoyl peroxide (BENZACLIN) gel Apply topically 2 (two) times daily. 04/02/23  Yes Jhordan Kinter K, PA-C  minocycline (MINOCIN) 100 MG capsule Take 1 capsule (100 mg total) by mouth 2 (two) times daily. 03/28/23  Yes Mardella Layman, MD  cetirizine (ZYRTEC) 10 MG tablet Take 1 tablet (10 mg total) by mouth daily. Patient not taking: Reported on 05/20/2019 05/01/19 05/23/20  [name], NP    Family History Family History  Problem Relation Age of Onset   Cancer Father        Died at 33   Healthy Mother     Social History Social History   Tobacco Use   Smoking status: Every Day    Current packs/day: 0.20    Types: Cigarettes   Smokeless tobacco: Never  Vaping Use   Vaping status: Some Days   Substances: Nicotine, Flavoring  Substance Use Topics   Alcohol use: Yes    Alcohol/week: 0.0 standard drinks of alcohol    Comment: socially   Drug use: No     Allergies   Patient has no known allergies.   Review of Systems Review of Systems  Constitutional:  Negative for activity change, appetite change, fatigue and fever.  Gastrointestinal:  Negative for abdominal pain, diarrhea, nausea and vomiting.  Skin:  Positive for rash.     Physical Exam Triage Vital Signs ED Triage Vitals [04/02/23 1557]  Encounter Vitals Group     BP (!) 144/77     Systolic BP Percentile      Diastolic BP Percentile      Pulse Rate 85     Resp 16     Temp 98.3 F (36.8 C)     Temp Source Oral     SpO2 98 %     Weight      Height      Head Circumference      Peak Flow      Pain Score      Pain Loc      Pain Education      Exclude from Growth Chart    No data found.  Updated Vital Signs BP (!) 144/77 (BP Location: Left Arm)   Pulse 85   Temp 98.3 F (36.8 C) (Oral)    Resp 16   SpO2 98%   Visual Acuity Right Eye Distance:   Left Eye Distance:   Bilateral Distance:    Right Eye Near:   Left Eye Near:    Bilateral Near:     Physical Exam Vitals reviewed.  Constitutional:      General: He is awake.     Appearance: Normal appearance. He is well-developed. He is not ill-appearing.     Comments: Very pleasant male appears stated age in no acute distress sitting comfortably in exam room  HENT:     Head: Normocephalic and atraumatic.     Mouth/Throat:     Pharynx: No oropharyngeal exudate, posterior oropharyngeal erythema or uvula swelling.  Cardiovascular:     Rate and Rhythm: Normal rate and regular rhythm.     Heart sounds: Normal heart sounds, S1 normal and S2 normal. No murmur heard. Pulmonary:     Effort: Pulmonary effort is normal.     Breath sounds: Normal breath sounds. No stridor. No wheezing, rhonchi or rales.     Comments: Clear to auscultation bilaterally Skin:    Findings: Rash present. Rash is pustular.     Comments: Pustular rash noted perioral region and along mandible bilaterally.  Neurological:     Mental Status: He is alert.  Psychiatric:        Behavior: Behavior is cooperative.      UC Treatments / Results  Labs (all labs ordered are listed, but only abnormal results are displayed) Labs Reviewed - No data to display  EKG   Radiology No results found.  Procedures Procedures (including critical care time)  Medications Ordered in UC Medications - No data to display  Initial Impression / Assessment and Plan / UC Course  I have reviewed the triage vital signs and the nursing notes.  Pertinent labs & imaging results that were available during my care of the patient were reviewed by me and considered in my medical decision making (see chart for details).     Patient is well-appearing, afebrile, nontoxic, nontachycardic.  No evidence of cellulitis or significant skin infection.  We discussed symptoms are most  consistent with acne.  He is already on appropriate treatment with minocycline and was encouraged to continue taking this as previously prescribed.  Will also add clindamycin and benzyl peroxide combination twice daily.  We discussed appropriate skin care procedures.  Discussed that ultimately he will need to see a dermatologist for further evaluation and management.  He recommended that he follow-up with them  as soon as possible and he will contact them to see if they can move forward his appointment.  Discussed that if he has any worsening or changing symptoms including additional lesions, enlarging lesion, spread of lesion, fever, nausea, vomiting he needs to be seen immediately.  Strict return precautions given.  Final Clinical Impressions(s) / UC Diagnoses   Final diagnoses:  Acne vulgaris     Discharge Instructions      Please follow-up with dermatology as soon as possible.  Make sure that you are keeping your face clean.  Continue minocycline as previously prescribed.  Apply clindamycin/benzyl peroxide twice daily.  Do not use additional over-the-counter acne medications as this can be too much medicine and cause scaling and dryness to the skin.  If you have any enlarging lesion, painful lesion, additional lesions, fever, nausea, vomiting you need to be seen immediately.     ED Prescriptions     Medication Sig Dispense Auth. Provider   clindamycin-benzoyl peroxide (BENZACLIN) gel Apply topically 2 (two) times daily. 50 g Saga Balthazar K, PA-C      PDMP not reviewed this encounter.   Jeani Hawking, PA-C 04/02/23 1628

## 2023-04-02 NOTE — ED Triage Notes (Signed)
Pt presents with bump on right side of his cheek x 1 week. Denies any pain at this time.

## 2023-04-02 NOTE — Discharge Instructions (Signed)
Please follow-up with dermatology as soon as possible.  Make sure that you are keeping your face clean.  Continue minocycline as previously prescribed.  Apply clindamycin/benzyl peroxide twice daily.  Do not use additional over-the-counter acne medications as this can be too much medicine and cause scaling and dryness to the skin.  If you have any enlarging lesion, painful lesion, additional lesions, fever, nausea, vomiting you need to be seen immediately.

## 2023-04-19 ENCOUNTER — Ambulatory Visit (HOSPITAL_COMMUNITY)
Admission: EM | Admit: 2023-04-19 | Discharge: 2023-04-19 | Disposition: A | Discharge status: Home / Self Care | Payer: Self-pay | Attending: Physician Assistant | Admitting: Physician Assistant

## 2023-04-19 ENCOUNTER — Encounter (HOSPITAL_COMMUNITY): Payer: Self-pay

## 2023-04-19 DIAGNOSIS — K130 Diseases of lips: Secondary | ICD-10-CM | POA: Insufficient documentation

## 2023-04-19 MED ORDER — MUPIROCIN 2 % EX OINT: 1.0000 | TOPICAL_OINTMENT | Freq: Every day | CUTANEOUS | 0 refills | Status: DC

## 2023-04-19 NOTE — Discharge Instructions (Signed)
I do not think this is related to cold sores/herpes but we are testing for this.  We will contact you if it is positive.  Keep the area clean.  Apply Bactroban ointment to encourage healing.  Follow-up with dermatology.  If you develop any additional lesions, sores in your mouth, difficulty swallowing, fever, nausea, vomiting you need to be seen immediately.

## 2023-04-19 NOTE — ED Provider Notes (Signed)
[facility]    CSN: [id number] Arrival date & time: 04/19/23  4098      History   Chief Complaint Chief Complaint  Patient presents with   Lip Issue    HPI Adam Guzman is a 27 y.o. male.   Patient presents today with a 24-hour history of painful lesion on his right lower lip.  He has a history of acne and has been seen for similar lesions multiple times in the past few months.  He has been started on acne treatment including minocycline and clindamycin/benzyl peroxide gel.  He reports that this has resolved the previous lesions until development of current lesion.  He reports that it is painful and he has a tingling sensation in the area.  Pain is rated 3 on a 0-10 pain scale, described as tingling/burning sensation, no alleviating factors identified.  He denies any changes to personal hygiene products including lipid products or oral hygiene products.  He denies history of cold sores or HSV but is very concerned that this could be what is causing his symptoms.  He is requesting testing today.  Denies any systemic symptoms including fever, nausea, vomiting, body aches.  Denies any oral lesions or additional symptoms.  He is eating and drinking normally.    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 Authorizing Provider  clindamycin-benzoyl peroxide (BENZACLIN) gel Apply topically 2 (two) times daily. 04/02/23  Yes Wanona Stare K, PA-C  minocycline (MINOCIN) 100 MG capsule Take 1 capsule (100 mg total) by mouth 2 (two) times daily. 03/28/23  Yes Hagler, Arlys John, MD  mupirocin ointment (BACTROBAN) 2 % Apply 1 Application topically daily. 04/19/23  Yes Yanai Hobson K, PA-C  cetirizine (ZYRTEC) 10 MG tablet Take 1 tablet (10 mg total) by mouth daily. Patient not taking: Reported on 05/20/2019 05/01/19 05/23/20  [name], NP    Family History Family History  Problem Relation Age of Onset   Cancer Father        Died at 61   Healthy Mother     Social History Social History   Tobacco Use   Smoking status: Every Day    Current packs/day: 0.20    Types: Cigarettes   Smokeless tobacco: Never  Vaping Use   Vaping status: Some Days   Substances: Nicotine, Flavoring  Substance Use Topics   Alcohol use: Yes    Alcohol/week: 0.0 standard drinks of alcohol    Comment: socially   Drug use: No     Allergies   Patient has no known allergies.   Review of Systems Review of Systems  Constitutional:  Positive for activity change. Negative for  appetite change, fatigue and fever.  HENT:  Negative for congestion, sinus pressure, sneezing and sore throat.   Respiratory:  Negative for cough and shortness of breath.   Cardiovascular:  Negative for chest pain.  Gastrointestinal:  Negative for abdominal pain, diarrhea, nausea and vomiting.  Skin:  Positive for wound.     Physical Exam Triage Vital Signs ED Triage Vitals  Encounter Vitals Group     BP 04/19/23 1106 131/78     Systolic BP Percentile --      Diastolic BP Percentile --      Pulse Rate 04/19/23 1106 76     Resp 04/19/23 1106 16     Temp 04/19/23 1106 97.7 F (36.5 C)      Temp Source 04/19/23 1106 Oral     SpO2 04/19/23 1106 99 %     Weight 04/19/23 1105 140 lb (63.5 kg)     Height 04/19/23 1105 5\' 11"  (1.803 m)     Head Circumference --      Peak Flow --      Pain Score 04/19/23 1105 0     Pain Loc --      Pain Education --      Exclude from Growth Chart --    No data found.  Updated Vital Signs BP 131/78 (BP Location: Left Arm)   Pulse 76   Temp 97.7 F (36.5 C) (Oral)   Resp 16   Ht 5\' 11"  (1.803 m)   Wt 140 lb (63.5 kg)   SpO2 99%   BMI 19.53 kg/m   Visual Acuity Right Eye Distance:   Left Eye Distance:   Bilateral Distance:    Right Eye Near:   Left Eye Near:    Bilateral Near:     Physical Exam Vitals reviewed.  Constitutional:      General: He is awake.     Appearance: Normal appearance. He is well-developed. He is not ill-appearing.     Comments: Very pleasant male appears stated age in no acute distress sitting comfortably in exam room  HENT:     Head: Normocephalic and atraumatic.     Mouth/Throat:     Lips: Lesions present.     Pharynx: Uvula midline. No oropharyngeal exudate or posterior oropharyngeal erythema.      Comments: Normal-appearing posterior oropharynx. Cardiovascular:     Rate and Rhythm: Normal rate and regular rhythm.     Heart sounds: Normal heart sounds, S1 normal and S2 normal. No murmur heard. Pulmonary:     Effort: Pulmonary effort is normal.     Breath sounds: Normal breath sounds. No stridor. No wheezing, rhonchi or rales.     Comments: Clear to auscultation bilaterally Neurological:     Mental Status: He is alert.  Psychiatric:        Behavior: Behavior is cooperative.      UC Treatments / Results  Labs (all labs ordered are listed, but only abnormal results are displayed) Labs Reviewed  HSV CULTURE AND TYPING    EKG   Radiology No results found.  Procedures Procedures (including critical care time)  Medications Ordered in UC Medications - No data to display  Initial  Impression / Assessment and Plan / UC Course  I have reviewed the triage vital signs and the nursing notes.  Pertinent labs & imaging results that were available during my care of the patient were reviewed by me and considered in my medical decision making (see chart for details).  Patient is well-appearing, afebrile, nontoxic, nontachycardic.  Low suspicion for HSV as etiology of symptoms but given that it is a slight ulceration with associated prodromal symptoms we will test for HSV.  Given low suspicion for HSV will defer antiviral therapy for the time being.  He was encouraged to keep area clean and use hypoallergenic lip moisturizers.  Bactroban was sent to pharmacy to be applied daily as needed.  Encouraged him to follow-up with dermatology for further evaluation and management.  Discussed that if he develops any worsening symptoms including additional lesions, oral lesions, swelling of his throat, fever, shortness of breath, nausea/vomiting he needs to be seen immediately.  Strict return precautions given.  Final Clinical Impressions(s) / UC Diagnoses   Final diagnoses:  Lip lesion     Discharge Instructions      I do not think this is related to cold sores/herpes but we are testing for this.  We will contact you if it is positive.  Keep the area clean.  Apply Bactroban ointment to encourage healing.  Follow-up with dermatology.  If you develop any additional lesions, sores in your mouth, difficulty swallowing, fever, nausea, vomiting you need to be seen immediately.     ED Prescriptions     Medication Sig Dispense Auth. Provider   mupirocin ointment (BACTROBAN) 2 % Apply 1 Application topically daily. 22 g Bay Wayson K, PA-C      PDMP not reviewed this encounter.   Jeani Hawking, PA-C 04/19/23 1201

## 2023-04-19 NOTE — ED Triage Notes (Signed)
Patient here today with c/o dark spot on the right side bottom lip that he noticed yesterday. Patient states that it just doesn't feel right and feels dry.

## 2023-04-22 ENCOUNTER — Ambulatory Visit (INDEPENDENT_AMBULATORY_CARE_PROVIDER_SITE_OTHER): Payer: Self-pay | Admitting: Student

## 2023-04-22 ENCOUNTER — Encounter: Payer: Self-pay | Admitting: Student

## 2023-04-22 VITALS — BP 110/70 | HR 83 | Wt 144.0 lb

## 2023-04-22 DIAGNOSIS — R220 Localized swelling, mass and lump, head: Secondary | ICD-10-CM

## 2023-04-22 DIAGNOSIS — Q8501 Neurofibromatosis, type 1: Secondary | ICD-10-CM

## 2023-04-22 LAB — HSV CULTURE AND TYPING

## 2023-04-22 NOTE — Assessment & Plan Note (Signed)
Needs MRI, discussed acuity of need, needs insurance.

## 2023-04-22 NOTE — Assessment & Plan Note (Signed)
Discussed that is important for the patient to obtain some sort of health and current, gave him information for our manage Medicaid although I am not sure he meets this criteria.  Try to obtain insurance through work so that we can obtain an MRI of the brain and so he can get appropriate health care for his neurofibromatosis.

## 2023-04-22 NOTE — Assessment & Plan Note (Signed)
Unsure if this is related to an NF nodule versus cystic nodule.  Given presentation, past medical history, location of nodule, will refer him to plastic surgery as it is very difficult to get into dermatology at this time.  Patient was instructed to await their call.

## 2023-04-22 NOTE — Progress Notes (Signed)
  SUBJECTIVE:   CHIEF COMPLAINT / HPI:   ED Follow Up:  Multiple ED visits, three in August 2024. Last visit with concern for painful lesion on the lip, negative HSV.   Also seen multiple times for acne, started most recently on Minocycline and had addition of Clindamycin and Benzoyl peroxide added to regimen.   However, has not received MRI brain as instructed on last visit for mandibular mass. Has been unable to obtain Health insurance through his work.   Acne  Forehead Nodule:  Patient presented last time for nodule on his forehead that was prescribed bacitracin.  This is not help to any meaningful extent, did have slight improvement but the nodule still present. No systemic symptoms.      PERTINENT  PMH / PSH:   Past Medical History:  Diagnosis Date   Neurofibromatosis [facility])     Patient Care Team: Alfredo Martinez, MD as PCP - General (Family Medicine) OBJECTIVE:  BP 110/70   Pulse 83   Wt 144 lb (65.3 kg)   SpO2 98%   BMI 20.08 kg/m  Physical Exam  General: NAD, pleasant, able to participate in exam Card: RRR Respiratory: No respiratory distress Skin: warm and dry, small fluctuant area over left eyebrow, slightly smaller in appearance, no drainage Psych: Normal affect and mood   ASSESSMENT/PLAN:  Neurofibromatosis, type 1 (von Recklinghausen's disease) (HCC) Assessment & Plan: Discussed that is important for the patient to obtain some sort of health and current, gave him information for our manage Medicaid although I am not sure he meets this criteria.  Try to obtain insurance through work so that we can obtain an MRI of the brain and so he can get appropriate health care for his neurofibromatosis.  Orders: -     Ambulatory referral to Plastic Surgery  Nodule of skin of head Assessment & Plan: Unsure if this is related to an NF nodule versus cystic nodule.  Given presentation, past medical history, location of nodule, will refer him to plastic surgery as it is  very difficult to get into dermatology at this time.  Patient was instructed to await their call.  Orders: -     Ambulatory referral to Plastic Surgery  Mass of mandible Assessment & Plan: Needs MRI, discussed acuity of need, needs insurance.     Return in about 2 months (around 06/22/2023) for NF Nodule. Alfredo Martinez, MD 04/22/2023, 9:32 AM PGY-3, [facility]

## 2023-04-22 NOTE — Patient Instructions (Addendum)
It was great to see you today! Thank you for choosing [facility] for your primary care.  Today we addressed: We will refer you to plastic surgery to evaluate the facial nodule  Please await their call  See below for medicaid and call for brain MRI    To apply: Go online to [email or web address]  OR  Call your local Department of Social Services(DSS) and complete a telephone application. Phone: [phone] OR  Lenox Ahr out a paper application and either mail, fax, email or drop off the application to your local DSS office. Addresses are below  OR  Apply in person at your local Department of Social Services (DSS) office. Addresses are below   Where to [employer] your Application:   [facility]  [address] [place], [place] [address]  [facility] [address] She[place] Maroon [place], [place] [address] ?  You should be eligible for Medicaid if:   You live in [place] You are ages 38 through 58 You are a citizen (some non-US citizens can also get health care coverage through [facility]). And if your household income falls within the chart below:   sehold size Total income, before taxes  Single Adults $1,676/month or less     ($20,120/year)  Family of 2 $2,267/month or less      ($27,214/year)  Family of 3 $2,859/month or less  ($34,307/year)  Family of 4 $3,450/month or less  ($41,400/year)  Each additional person add $591/month (add $7,094/year)   Information you will need:  Full legal name Date of birth Social Security number (or immigration documents) [employer] residency Income information (from [place], W-2 forms, tax returns or business records)  If you haven't already, sign up for My Chart to have easy access to your labs results, and communication with your primary care physician. I recommend that you always bring your medications to each appointment as this makes it easy to ensure you are on the correct medications and helps [place] not miss refills when you  need them. Call the clinic at [phone] if your symptoms worsen or you have any concerns. Return in about 2 months (around 06/22/2023) for NF Nodule. Please arrive 15 minutes before your appointment to ensure smooth check in process.  We appreciate your efforts in making this happen.  Thank you for allowing me to participate in your care, Alfredo Martinez, MD 04/22/2023, 9:16 AM PGY-3, [facility]

## 2023-04-30 ENCOUNTER — Other Ambulatory Visit: Payer: Self-pay

## 2023-04-30 ENCOUNTER — Encounter (HOSPITAL_COMMUNITY): Payer: Self-pay

## 2023-04-30 ENCOUNTER — Emergency Department (HOSPITAL_COMMUNITY)
Admission: EM | Admit: 2023-04-30 | Discharge: 2023-04-30 | Disposition: A | Discharge status: Home / Self Care | Payer: Self-pay | Attending: Emergency Medicine | Admitting: Emergency Medicine

## 2023-04-30 DIAGNOSIS — K029 Dental caries, unspecified: Secondary | ICD-10-CM | POA: Insufficient documentation

## 2023-04-30 MED ORDER — KETOROLAC TROMETHAMINE 15 MG/ML IJ SOLN
15.0000 mg | Freq: Once | INTRAMUSCULAR | Status: AC
  Administered 2023-04-30: 15 mg via INTRAMUSCULAR
  Filled 2023-04-30: qty 1

## 2023-04-30 MED ORDER — AMOXICILLIN-POT CLAVULANATE 875-125 MG PO TABS: 1.0000 | ORAL_TABLET | Freq: Two times a day (BID) | ORAL | 0 refills | Status: DC

## 2023-04-30 MED ORDER — IBUPROFEN 600 MG PO TABS: 600.0000 mg | ORAL_TABLET | Freq: Four times a day (QID) | ORAL | 1 refills | Status: DC | PRN

## 2023-04-30 MED ORDER — LIDOCAINE VISCOUS HCL 2 % MT SOLN: 5.0000 mL | Freq: Three times a day (TID) | OROMUCOSAL | 0 refills | Status: DC

## 2023-04-30 MED ORDER — ACETAMINOPHEN 325 MG PO TABS
650.0000 mg | ORAL_TABLET | Freq: Once | ORAL | Status: AC
  Administered 2023-04-30: 650 mg via ORAL
  Filled 2023-04-30: qty 2

## 2023-04-30 NOTE — ED Triage Notes (Signed)
Pt reports with left lower dental pain x 2 weeks. Pt reports having a broken tooth.

## 2023-04-30 NOTE — ED Provider Notes (Signed)
[place] EMERGENCY DEPARTMENT AT [facility] Provider Note   CSN: [id number] Arrival date & time: 04/30/23  4696     History  Chief Complaint  Patient presents with   Dental Pain    Adam Guzman is a 27 y.o. male with medical history of neurofibromatosis.  Patient presents to ED for evaluation of dental pain.  The patient reports that for the last 2 weeks he has had left lower dental pain.  Reports he has not seen a dentist in "a very long time".  Unable to get into see dentist today so came here.  Patient denies fevers, nausea, vomiting, trouble swallowing, shortness of breath, facial swelling.  States that he has not taken any medication for this until this morning when his mother gave him Motrin but he states it is not working.  Endorses a history of the same.   Dental Pain Associated symptoms: no drooling and no fever        Home Medications Prior to Admission medications   Medication Sig Start Date End Date Taking? Authorizing Provider  amoxicillin-clavulanate (AUGMENTIN) 875-125 MG tablet Take 1 tablet by mouth 2 (two) times daily. 04/30/23  Yes Al Decant, PA-C  ibuprofen (ADVIL) 600 MG tablet Take 1 tablet (600 mg total) by mouth every 6 (six) hours as needed. 04/30/23  Yes Al Decant, PA-C  magic mouthwash (lidocaine, diphenhydrAMINE, alum & mag hydroxide) suspension Swish and spit 5 mLs 3 (three) times daily. 04/30/23  Yes Al Decant, PA-C  clindamycin-benzoyl peroxide (BENZACLIN) gel Apply topically 2 (two) times daily. 04/02/23   Raspet, Noberto Retort, PA-C  minocycline (MINOCIN) 100 MG capsule Take 1 capsule (100 mg total) by mouth 2 (two) times daily. 03/28/23   Mardella Layman, MD  mupirocin ointment (BACTROBAN) 2 % Apply 1 Application topically daily. 04/19/23   Raspet, Noberto Retort, PA-C      Allergies    Patient has no known allergies.    Review of Systems   Review of Systems  Constitutional:  Negative for fever.  HENT:  Positive for  dental problem. Negative for drooling, sore throat, trouble swallowing and voice change.   All other systems reviewed and are negative.   Physical Exam Updated Vital Signs BP (!) 135/99   Pulse 73   Temp 98.5 F (36.9 C) (Oral)   Resp 18   Ht 5\' 11"  (1.803 m)   Wt 65.3 kg   SpO2 100%   BMI 20.08 kg/m  Physical Exam Vitals and nursing note reviewed.  Constitutional:      General: He is not in acute distress.    Appearance: He is well-developed.  HENT:     Head: Normocephalic and atraumatic.     Mouth/Throat:     Comments: Extensive dental caries throughout.  No abscess in need of drainage.  Uvula midline.  Handling secretions appropriately.  No pooling of saliva. Eyes:     Conjunctiva/sclera: Conjunctivae normal.  Cardiovascular:     Rate and Rhythm: Normal rate and regular rhythm.     Heart sounds: No murmur heard. Pulmonary:     Effort: Pulmonary effort is normal. No respiratory distress.     Breath sounds: Normal breath sounds.  Abdominal:     Palpations: Abdomen is soft.     Tenderness: There is no abdominal tenderness.  Musculoskeletal:        General: No swelling.     Cervical back: Neck supple.  Skin:    General: Skin is warm  and dry.     Capillary Refill: Capillary refill takes less than 2 seconds.  Neurological:     Mental Status: He is alert.  Psychiatric:        Mood and Affect: Mood normal.     ED Results / Procedures / Treatments   Labs (all labs ordered are listed, but only abnormal results are displayed) Labs Reviewed - No data to display  EKG None  Radiology No results found.  Procedures Procedures   Medications Ordered in ED Medications  ketorolac (TORADOL) 15 MG/ML injection 15 mg (has no administration in time range)  acetaminophen (TYLENOL) tablet 650 mg (has no administration in time range)    ED Course/ Medical Decision Making/ A&P    Medical Decision Making  27 year old who presents to the ED for evaluation of left lower  dental pain.  Please see HPI for further details.  On examination the patient posterior oropharynx has no erythema, there is no exudate.  Uvula is midline, handling secretions appropriately, no drooling, no change in phonation.  No abscess needing drainage.  He does have extensive dental caries throughout.  Nontoxic patient.  No facial swelling.  Will provide Toradol, Tylenol.  Will provide prescription for Augmentin.  Will provide dental resource guide.  Will provide Magic mouthwash.  Patient advised to follow-up with PCP, dentist.  The patient was encouraged to return to the ED with any new or worsening signs or symptoms and he voiced understanding.  Patient discharged in stable condition.   Final Clinical Impression(s) / ED Diagnoses Final diagnoses:  Pain due to dental caries    Rx / DC Orders ED Discharge Orders          Ordered    amoxicillin-clavulanate (AUGMENTIN) 875-125 MG tablet  2 times daily        04/30/23 0738    magic mouthwash (lidocaine, diphenhydrAMINE, alum & mag hydroxide) suspension  3 times daily        04/30/23 0738    ibuprofen (ADVIL) 600 MG tablet  Every 6 hours PRN        04/30/23 0738              Al Decant, PA-C 04/30/23 6295    Terald Sleeper, MD 04/30/23 [phone]

## 2023-04-30 NOTE — Discharge Instructions (Signed)
It was a pleasure taking part in your care today.  As we discussed, you will need to see a dentist for further care.  Please review the attached dental resource guide and follow-up with dentist in the area.  Please continue taking ibuprofen or Tylenol every 6 hours as needed for pain.  Please utilize Magic mouthwash that I provided and follow instructions.  Please begin taking antibiotic, Augmentin, twice a day for the next 7 days.  Please return to the ED with any new symptoms such as nausea, vomiting, fevers or the inability to swallow your saliva.

## 2023-05-01 ENCOUNTER — Encounter: Payer: Self-pay | Admitting: Student

## 2023-05-01 DIAGNOSIS — L739 Follicular disorder, unspecified: Secondary | ICD-10-CM

## 2023-05-06 ENCOUNTER — Other Ambulatory Visit: Payer: Self-pay

## 2023-05-06 ENCOUNTER — Emergency Department (HOSPITAL_COMMUNITY)
Admission: EM | Admit: 2023-05-06 | Discharge: 2023-05-06 | Disposition: A | Discharge status: Home / Self Care | Payer: Self-pay | Attending: Emergency Medicine | Admitting: Emergency Medicine

## 2023-05-06 ENCOUNTER — Ambulatory Visit (HOSPITAL_COMMUNITY)
Admission: RE | Admit: 2023-05-06 | Discharge: 2023-05-06 | Disposition: A | Discharge status: Home / Self Care | Payer: Self-pay | Source: Ambulatory Visit | Attending: Student | Admitting: Student

## 2023-05-06 ENCOUNTER — Encounter (HOSPITAL_COMMUNITY): Payer: Self-pay

## 2023-05-06 VITALS — BP 113/74 | HR 86 | Temp 98.1°F | Resp 18

## 2023-05-06 DIAGNOSIS — K130 Diseases of lips: Secondary | ICD-10-CM

## 2023-05-06 DIAGNOSIS — L905 Scar conditions and fibrosis of skin: Secondary | ICD-10-CM | POA: Insufficient documentation

## 2023-05-06 NOTE — ED Provider Notes (Signed)
[facility]    CSN: [id number] Arrival date & time: 05/06/23  1756      History   Chief Complaint Chief Complaint  Patient presents with   Lip Laceration    HPI Adam Guzman is a 27 y.o. male.   Patient presents today with lesion to right side of lower lip that has been present for about 10 days.  He is worried it is a scar that is not healing.  Was seen in ER earlier today and does not feel like they helped him very much.  Reports the lesion was tested for HSV and was negative and that was his biggest concern.  He has been using the mupirocin ointment and thinks it made the area bigger.  Reports he has a dermatologist appointment in approximately 2 weeks.  Denies fevers, pain, or drainage from the area.  Reports it itches a little bit.  Thinks it is getting bigger in size.  Patient admits that he bites the area frequently and rubs the area frequently.    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 Authorizing Provider  amoxicillin-clavulanate (AUGMENTIN) 875-125 MG tablet Take 1 tablet by mouth 2 (two) times daily. 04/30/23   Al Decant, PA-C  clindamycin-benzoyl peroxide (BENZACLIN) gel Apply topically 2 (two) times daily. 04/02/23   Raspet, Noberto Retort, PA-C  ibuprofen (ADVIL) 600 MG tablet Take 1 tablet (600 mg total) by mouth every 6 (six) hours as needed. 04/30/23   Al Decant, PA-C  magic  mouthwash (lidocaine, diphenhydrAMINE, alum & mag hydroxide) suspension Swish and spit 5 mLs 3 (three) times daily. 04/30/23   Al Decant, PA-C  minocycline (MINOCIN) 100 MG capsule Take 1 capsule (100 mg total) by mouth 2 (two) times daily. 03/28/23   Mardella Layman, MD  mupirocin ointment (BACTROBAN) 2 % Apply 1 Application topically daily. 04/19/23   Raspet, Noberto Retort, PA-C    Family History Family History  Problem Relation Age of Onset   Cancer Father        Died at 54   Healthy Mother     Social History Social History   Tobacco Use   Smoking status: Every Day    Current packs/day: 0.20    Types: Cigarettes    Passive exposure: Current   Smokeless tobacco: Never  Vaping Use   Vaping status: Some Days   Substances: Nicotine, Flavoring  Substance Use Topics   Alcohol use: Yes    Alcohol/week: 0.0 standard drinks of alcohol    Comment: socially   Drug use: No     Allergies   Patient has no known allergies.   Review of Systems Review of Systems Per HPI  Physical Exam Triage Vital Signs ED Triage Vitals [05/06/23 1834]  Encounter Vitals Group     BP 113/74     Systolic BP Percentile      Diastolic BP Percentile  Pulse Rate 86     Resp 18     Temp 98.1 F (36.7 C)     Temp Source Oral     SpO2 98 %     Weight      Height      Head Circumference      Peak Flow      Pain Score      Pain Loc      Pain Education      Exclude from Growth Chart    No data found.  Updated Vital Signs BP 113/74 (BP Location: Left Arm)   Pulse 86   Temp 98.1 F (36.7 C) (Oral)   Resp 18   SpO2 98%   Visual Acuity Right Eye Distance:   Left Eye Distance:   Bilateral Distance:    Right Eye Near:   Left Eye Near:    Bilateral Near:     Physical Exam Vitals and nursing note reviewed.  Constitutional:      General: He is not in acute distress.    Appearance: Normal appearance. He is not toxic-appearing.  HENT:     Head: Normocephalic and atraumatic.      Mouth/Throat:     Mouth: Mucous membranes are moist.  Pulmonary:     Effort: Pulmonary effort is normal. No respiratory distress.  Skin:    General: Skin is warm and dry.     Capillary Refill: Capillary refill takes less than 2 seconds.     Coloration: Skin is not jaundiced or pale.     Findings: Lesion present.     Comments: Hyperpigmented lesion noted to bottom right lip.  No surrounding erythema, warmth, active drainage, or tenderness.  Neurological:     Mental Status: He is alert and oriented to person, place, and time.  Psychiatric:        Behavior: Behavior is cooperative.      UC Treatments / Results  Labs (all labs ordered are listed, but only abnormal results are displayed) Labs Reviewed - No data to display  EKG   Radiology No results found.  Procedures Procedures (including critical care time)  Medications Ordered in UC Medications - No data to display  Initial Impression / Assessment and Plan / UC Course  I have reviewed the triage vital signs and the nursing notes.  Pertinent labs & imaging results that were available during my care of the patient were reviewed by me and considered in my medical decision making (see chart for details).   Patient is well-appearing, normotensive, afebrile, not tachycardic, not tachypneic, oxygenating well on room air.    1. Lip lesion Unclear etiology HSV test negative, mupirocin made worse Recommended using plain petroleum like Vaseline or Aquaphor, avoid touching it or messing with the area Follow-up with dermatology as planned-may need biopsy if not improving  The patient was given the opportunity to ask questions.  All questions answered to their satisfaction.  The patient is in agreement to this plan.    Final Clinical Impressions(s) / UC Diagnoses   Final diagnoses:  Lip lesion     Discharge Instructions      Start using plain Vaseline or Aquaphor over the spot on your lip.  If this does not help it heal,  discuss with Dermatology at follow up visit later this month.  Seek care sooner if area becomes swollen, painful, or starts oozing.    ED Prescriptions   None    PDMP not reviewed this encounter.  Valentino Nose, NP 05/06/23 Windell Moment

## 2023-05-06 NOTE — Discharge Instructions (Signed)
Start using plain Vaseline or Aquaphor over the spot on your lip.  If this does not help it heal, discuss with Dermatology at follow up visit later this month.  Seek care sooner if area becomes swollen, painful, or starts oozing.

## 2023-05-06 NOTE — Discharge Instructions (Signed)
Please follow-up with your dermatologist regarding recent symptoms and ER visit.  Today your exam shows you most likely had a scar that is from your acne and your dermatologist can help you with improving the look of the scar.  You did not show any signs of infection today on exam and so antibiotic was not ordered at this time.  Please continue to use your acne medication as prescribed and see your dermatologist for further management.  If symptoms change or worsen please return to ER.

## 2023-05-06 NOTE — ED Provider Notes (Signed)
[place] EMERGENCY DEPARTMENT AT [facility] Provider Note   CSN: [id number] Arrival date & time: 05/06/23  1310     History  Chief Complaint  Patient presents with   Skin Problem    Adam Guzman is a 27 y.o. male presented with a scar on his chin for the past 2 weeks.  Patient states he was seen previously at the urgent care and was given mupirocin ointment but states this did not been helping a scar.  Patient states that the scar is the same as when he was seen previously at the urgent care.  Patient stated that his HSV testing at that time was also negative.  Patient does see dermatologist as he reports that he is on clindamycin and benzyl peroxide for his acne vulgaris but wanted to be seen earlier.  Patient notes he bites his lip often but not enough to cause the scar.  Patient states he is currently on Augmentin for dental prophylaxis but denies any intraoral complaints and states that the scar has been around since before he was placed on the Augmentin.  Otherwise patient denies any new medications, lotions, soaps.  Patient denies purulent drainage, redness in the area, warmth to the area, scar changes, intraoral lesions or abnormalities, facial trauma  Home Medications Prior to Admission medications   Medication Sig Start Date End Date Taking? Authorizing Provider  amoxicillin-clavulanate (AUGMENTIN) 875-125 MG tablet Take 1 tablet by mouth 2 (two) times daily. 04/30/23   Al Decant, PA-C  clindamycin-benzoyl peroxide (BENZACLIN) gel Apply topically 2 (two) times daily. 04/02/23   Raspet, Noberto Retort, PA-C  ibuprofen (ADVIL) 600 MG tablet Take 1 tablet (600 mg total) by mouth every 6 (six) hours as needed. 04/30/23   Al Decant, PA-C  magic mouthwash (lidocaine, diphenhydrAMINE, alum & mag hydroxide) suspension Swish and spit 5 mLs 3 (three) times daily. 04/30/23   Al Decant, PA-C  minocycline (MINOCIN) 100 MG capsule Take 1 capsule (100 mg total) by  mouth 2 (two) times daily. 03/28/23   Mardella Layman, MD  mupirocin ointment (BACTROBAN) 2 % Apply 1 Application topically daily. 04/19/23   Raspet, Noberto Retort, PA-C      Allergies    Patient has no known allergies.    Review of Systems   Review of Systems  Physical Exam Updated Vital Signs BP (!) 144/88   Pulse 85   Temp 97.9 F (36.6 C) (Oral)   Resp 17   Ht 5\' 11"  (1.803 m)   Wt 63.5 kg   SpO2 99%   BMI 19.53 kg/m  Physical Exam Constitutional:      General: He is not in acute distress.    Comments: Resting comfortably in the room  HENT:     Head:     Comments: No facial or neck swelling noted Skin:    Comments: Face: Close comedones no noted around patient's chin; tiny scar noted to right lower lip that was not warm to palpation, no purulent drainage, tender to palpation No intraoral lesions noted Tolerating secretions No muffled voice Lips do not appear asymmetric or swollen  Neurological:     Mental Status: He is alert.     ED Results / Procedures / Treatments   Labs (all labs ordered are listed, but only abnormal results are displayed) Labs Reviewed - No data to display  EKG None  Radiology No results found.  Procedures Procedures    Medications Ordered in ED Medications - No data  to display  ED Course/ Medical Decision Making/ A&P                                 Medical Decision Making  Adam Guzman 27 y.o. presented today for rash. Working DDx that I considered at this time includes, but not limited to, rash secondary to acne, contact dermatitis, SJS/TEN, DRESS syndrome, allergic reaction, shingles, chickenpox, eczema, candidiasis.  R/o DDx: contact dermatitis, SJS/TEN, DRESS syndrome, allergic reaction, shingles, chickenpox, eczema, candidiasis: These are considered less likely due to history of present illness, physical exam, labs/imaging findings.  Review of prior external notes: 04/19/2023 ED provider  Unique Tests and My Interpretation:  None  Discussion with Independent Historian: None  Discussion of Management of Tests: None  Risk: Low: based on diagnostic testing/clinical impression and treatment plan  Risk Stratification Score: None  Plan: On exam patient was in no acute distress with stable vitals.  On exam patient did have what appears to be a very tiny scar on the right inferior portion of his lip that otherwise did not have any remarkable findings.  The rest the patient's physical exam is unremarkable as well.  Patient does note he is history of persistent acne vulgaris and he is currently on clindamycin along with benzyl peroxide and sees dermatology and has a dermatologist appointment.  Patient did have negative HSV testing at the urgent care when seen there and did not endorse any new symptoms from then until now.  I have suspect patient's scar is secondary to his acne vulgaris as opposed to other etiology and encouraged patient to continue take his acne medications as prescribed to follow-up with his dermatologist for further management.  Patient was given return precautions. Patient stable for discharge at this time.  Patient verbalized understanding of plan.         Final Clinical Impression(s) / ED Diagnoses Final diagnoses:  Scar    Rx / DC Orders ED Discharge Orders     None         Remi Deter 05/06/23 1403    Gwyneth Sprout, MD 05/06/23 1644

## 2023-05-06 NOTE — ED Triage Notes (Signed)
Pt is here for lip scar. Pt was seen several times for this same problem.

## 2023-05-06 NOTE — ED Triage Notes (Signed)
Patient said he noticed a scar on his chin since Thursday. Stated it is not getting smaller after putting ointment on there.

## 2023-05-08 ENCOUNTER — Other Ambulatory Visit: Payer: Self-pay | Admitting: Student

## 2023-05-08 DIAGNOSIS — Q8501 Neurofibromatosis, type 1: Secondary | ICD-10-CM

## 2023-05-20 ENCOUNTER — Emergency Department (HOSPITAL_COMMUNITY)
Admission: EM | Admit: 2023-05-20 | Discharge: 2023-05-20 | Disposition: A | Discharge status: Home / Self Care | Payer: Self-pay | Attending: Emergency Medicine | Admitting: Emergency Medicine

## 2023-05-20 DIAGNOSIS — K0889 Other specified disorders of teeth and supporting structures: Secondary | ICD-10-CM

## 2023-05-20 DIAGNOSIS — K029 Dental caries, unspecified: Secondary | ICD-10-CM | POA: Insufficient documentation

## 2023-05-20 MED ORDER — PENICILLIN V POTASSIUM 500 MG PO TABS: 500.0000 mg | ORAL_TABLET | Freq: Four times a day (QID) | ORAL | 0 refills | Status: DC

## 2023-05-20 MED ORDER — DICLOFENAC SODIUM 50 MG PO TBEC: 50.0000 mg | DELAYED_RELEASE_TABLET | Freq: Two times a day (BID) | ORAL | 0 refills | Status: AC

## 2023-05-20 NOTE — Discharge Instructions (Signed)
See your dentist as scheduled. Rinse with Listerine after every meal. Take antibiotics as prescribed and complete the full course. For pain: first try 600mg  Advil Liquid Gels with 650mg  of Tylenol every 6 hours. If this does not help, THEN start the diclofenac and take as prescribed.

## 2023-05-20 NOTE — ED Provider Notes (Signed)
EMERGENCY DEPARTMENT AT [facility] Provider Note   CSN: [id number] Arrival date & time: 05/20/23  1329     History  Chief Complaint  Patient presents with   Dental Pain    Adam Guzman is a 27 y.o. male.  27 year old male with left lower dental pain, onset this morning. Denies injury, no fevers, no drainage. States he has a cracked tooth. Took IBU without relief. Scheduled to see his DDS next week.        Home Medications Prior to Admission medications   Medication Sig Start Date End Date Taking? Authorizing Provider  diclofenac (VOLTAREN) 50 MG EC tablet Take 1 tablet (50 mg total) by mouth 2 (two) times daily for 10 days. 05/20/23 05/30/23 Yes Jeannie Fend, PA-C  penicillin v potassium (VEETID) 500 MG tablet Take 1 tablet (500 mg total) by mouth 4 (four) times daily for 10 days. 05/20/23 05/30/23 Yes Jeannie Fend, PA-C  magic mouthwash (lidocaine, diphenhydrAMINE, alum & mag hydroxide) suspension Swish and spit 5 mLs 3 (three) times daily. 04/30/23   Al Decant, PA-C      Allergies    Patient has no known allergies.    Review of Systems   Review of Systems Negative except as per HPI Physical Exam Updated Vital Signs BP 135/81 (BP Location: Left Arm)   Pulse 78   Temp 98.4 F (36.9 C) (Oral)   Resp 16   SpO2 100%  Physical Exam Vitals and nursing note reviewed.  Constitutional:      General: He is not in acute distress.    Appearance: He is well-developed. He is not diaphoretic.  HENT:     Head: Normocephalic and atraumatic.     Jaw: No trismus.     Mouth/Throat:     Dentition: Abnormal dentition. Dental tenderness and dental caries present. No gingival swelling or dental abscesses.     Comments: Significant left lower dental decay without obvious abscess  Pulmonary:     Effort: Pulmonary effort is normal.  Skin:    General: Skin is warm and dry.     Findings: No erythema or rash.  Neurological:     Mental Status: He  is alert and oriented to person, place, and time.  Psychiatric:        Behavior: Behavior normal.     ED Results / Procedures / Treatments   Labs (all labs ordered are listed, but only abnormal results are displayed) Labs Reviewed - No data to display  EKG None  Radiology No results found.  Procedures Procedures    Medications Ordered in ED Medications - No data to display  ED Course/ Medical Decision Making/ A&P                                 Medical Decision Making Risk Prescription drug management.   27 year old male with complaint of left lower dental pain as above. Found to have significant decay to his left lower teeth without drainable collection. No trismus. Patient is scheduled to see his DDS next week. Will treat with Penicillin, recommend advil liquid gels and tylenol, if this does not provide relief, can try rx diclofenac.         Final Clinical Impression(s) / ED Diagnoses Final diagnoses:  Pain, dental    Rx / DC Orders ED Discharge Orders  Ordered    penicillin v potassium (VEETID) 500 MG tablet  4 times daily        05/20/23 1344    diclofenac (VOLTAREN) 50 MG EC tablet  2 times daily        05/20/23 1344              Alden Hipp 05/20/23 1358    Terald Sleeper, MD 05/20/23 1410

## 2023-05-20 NOTE — ED Triage Notes (Signed)
Pt reports lower left jaw pain that woke him up from sleep. Tried motrin with no relief.

## 2023-05-23 ENCOUNTER — Ambulatory Visit (INDEPENDENT_AMBULATORY_CARE_PROVIDER_SITE_OTHER): Payer: Self-pay | Admitting: Plastic Surgery

## 2023-05-23 ENCOUNTER — Ambulatory Visit (HOSPITAL_COMMUNITY)
Admission: EM | Admit: 2023-05-23 | Discharge: 2023-05-23 | Disposition: A | Discharge status: Home / Self Care | Payer: Self-pay | Attending: Nurse Practitioner | Admitting: Nurse Practitioner

## 2023-05-23 ENCOUNTER — Encounter (HOSPITAL_COMMUNITY): Payer: Self-pay

## 2023-05-23 ENCOUNTER — Telehealth (HOSPITAL_COMMUNITY): Payer: Self-pay

## 2023-05-23 ENCOUNTER — Encounter: Payer: Self-pay | Admitting: Plastic Surgery

## 2023-05-23 VITALS — BP 128/85 | HR 86 | Ht 71.0 in | Wt 143.2 lb

## 2023-05-23 DIAGNOSIS — J039 Acute tonsillitis, unspecified: Secondary | ICD-10-CM | POA: Insufficient documentation

## 2023-05-23 DIAGNOSIS — D485 Neoplasm of uncertain behavior of skin: Secondary | ICD-10-CM

## 2023-05-23 DIAGNOSIS — D489 Neoplasm of uncertain behavior, unspecified: Secondary | ICD-10-CM

## 2023-05-23 LAB — POCT RAPID STREP A (OFFICE): Rapid Strep A Screen: NEGATIVE

## 2023-05-23 MED ORDER — AMOXICILLIN 500 MG PO CAPS: 500.0000 mg | ORAL_CAPSULE | Freq: Two times a day (BID) | ORAL | 0 refills | Status: AC

## 2023-05-23 NOTE — ED Provider Notes (Signed)
[facility]    CSN: [id number] Arrival date & time: 05/23/23  1226      History   Chief Complaint Chief Complaint  Patient presents with   Sore Throat   Fever    HPI Adam Guzman is a 27 y.o. male.   Patient presents today for 1 day history of sore throat and fever.  He denies significant cough, congestion, chest pain, or shortness of breath.  No runny/stuffy nose, headache, ear pain, abdominal pain, or nausea/vomiting, diarrhea.  Has not taken anything for symptoms so far.  No known sick contacts.  Reports history of strep throat 1 year ago and had similar symptoms.    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 Authorizing Provider  amoxicillin (AMOXIL) 500 MG capsule Take 1 capsule (500 mg total) by mouth 2 (two) times daily for 10 days. 05/23/23 06/02/23 Yes Valentino Nose, NP  diclofenac (VOLTAREN) 50 MG EC tablet Take 1 tablet (50 mg total) by mouth 2 (two) times daily for 10 days. 05/20/23 05/30/23  Jeannie Fend, PA-C    Family History Family History  Problem Relation Age of Onset   Cancer Father        Died at 3   Healthy Mother     Social History Social History   Tobacco Use   Smoking status: Every Day    Current packs/day: 0.50    Types: Cigarettes    Passive exposure: Current   Smokeless tobacco: Never  Vaping Use   Vaping status: Some Days   Substances: Nicotine, Flavoring   Substance Use Topics   Alcohol use: Not Currently    Comment: socially   Drug use: No     Allergies   Patient has no known allergies.   Review of Systems Review of Systems Per HPI  Physical Exam Triage Vital Signs ED Triage Vitals  Encounter Vitals Group     BP 05/23/23 1323 134/83     Systolic BP Percentile --      Diastolic BP Percentile --      Pulse Rate 05/23/23 1323 65     Resp 05/23/23 1323 14     Temp 05/23/23 1323 97.8 F (36.6 C)     Temp Source 05/23/23 1323 Oral     SpO2 05/23/23 1323 98 %     Weight --      Height --      Head Circumference --      Peak Flow --      Pain Score 05/23/23 1325 10     Pain Loc --      Pain Education --      Exclude from Growth Chart --    No data found.  Updated Vital Signs BP 134/83 (BP Location: Right Arm)   Pulse 65   Temp 97.8 F (36.6 C) (Oral)   Resp 14   SpO2 98%  Visual Acuity Right Eye Distance:   Left Eye Distance:   Bilateral Distance:    Right Eye Near:   Left Eye Near:    Bilateral Near:     Physical Exam Vitals and nursing note reviewed.  Constitutional:      General: He is not in acute distress.    Appearance: He is well-developed. He is not ill-appearing, toxic-appearing or diaphoretic.  HENT:     Head: Normocephalic and atraumatic.     Right Ear: Tympanic membrane and ear canal normal. No drainage, swelling or tenderness. No middle ear effusion. Tympanic membrane is not erythematous.     Left Ear: Tympanic membrane and ear canal normal. No drainage, swelling or tenderness.  No middle ear effusion. Tympanic membrane is not erythematous.     Nose: No congestion or rhinorrhea.     Mouth/Throat:     Mouth: Mucous membranes are moist.     Pharynx: Posterior oropharyngeal erythema present. No uvula swelling.     Tonsils: Tonsillar exudate present. No tonsillar abscesses. 1+ on the right. 1+ on the left.  Eyes:     Conjunctiva/sclera: Conjunctivae normal.  Cardiovascular:     Rate and  Rhythm: Normal rate.     Heart sounds: Normal heart sounds.  Pulmonary:     Effort: Pulmonary effort is normal. No respiratory distress.     Breath sounds: Normal breath sounds. No wheezing, rhonchi or rales.  Musculoskeletal:     Cervical back: Neck supple.  Lymphadenopathy:     Cervical: No cervical adenopathy.  Skin:    General: Skin is warm and dry.     Coloration: Skin is not pale.     Findings: No erythema or rash.  Neurological:     Mental Status: He is alert and oriented to person, place, and time.      UC Treatments / Results  Labs (all labs ordered are listed, but only abnormal results are displayed) Labs Reviewed  CULTURE, GROUP A STREP [facility])  POCT RAPID STREP A (OFFICE)    EKG   Radiology No results found.  Procedures Procedures (including critical care time)  Medications Ordered in UC Medications - No data to display  Initial Impression / Assessment and Plan / UC Course  I have reviewed the triage vital signs and the nursing notes.  Pertinent labs & imaging results that were available during my care of the patient were reviewed by me and considered in my medical decision making (see chart for details).   Patient is well-appearing, normotensive, afebrile, not tachycardic, not tachypneic, oxygenating well on room air.    1. Acute tonsillitis, unspecified etiology Rapid strep negative today; throat culture pending Will treat empirically for strep throat with amoxicillin twice daily for 10 days; change toothbrush after starting treatment Strict ER and return precautions discussed Work excuse given   The patient was given the opportunity to ask questions.  All questions answered to their satisfaction.  The patient is in agreement to this plan.    Final Clinical Impressions(s) / UC Diagnoses   Final diagnoses:  Acute tonsillitis, unspecified etiology     Discharge Instructions      Take the amoxicillin as prescribed to treat strep throat.  We  will contact you if the throat culture shows you need to stop the antibiotic early next week.  Change toothbrush today or tomorrow to prevent reinfection.  Seek care if symptoms do not improve or if they worsen with this treatment.    ED Prescriptions  Medication Sig Dispense Auth. Provider   amoxicillin (AMOXIL) 500 MG capsule Take 1 capsule (500 mg total) by mouth 2 (two) times daily for 10 days. 20 capsule Valentino Nose, NP      PDMP not reviewed this encounter.   Valentino Nose, NP 05/23/23 [phone]

## 2023-05-23 NOTE — Progress Notes (Signed)
   Referring Provider Alfredo Martinez, MD [address] [place],  [place] [address]   CC:  Chief Complaint  Patient presents with   Consult      Adam Guzman is an 27 y.o. male.  HPI: Adam Guzman is a 26 year old male who presents today with complaints of multiple facial lesions which she would like to have removed.  Adam Guzman is not sure how long these have been present.  They do not cause any pain and Adam Guzman has not had any drainage from them.  No Known Allergies  Outpatient Encounter Medications as of 05/23/2023  Medication Sig   diclofenac (VOLTAREN) 50 MG EC tablet Take 1 tablet (50 mg total) by mouth 2 (two) times daily for 10 days.   magic mouthwash (lidocaine, diphenhydrAMINE, alum & mag hydroxide) suspension Swish and spit 5 mLs 3 (three) times daily.   penicillin v potassium (VEETID) 500 MG tablet Take 1 tablet (500 mg total) by mouth 4 (four) times daily for 10 days.   No facility-administered encounter medications on file as of 05/23/2023.     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   Adam Guzman graduated from [employer].    Adam Guzman lives with his mother, brother, and step-dad.    Adam Guzman enjoys playing video games, riding his bike, watching TV, and parkour.   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  Adam Guzman notes 1 at the vermilion border right side which is not palpable.  Adam Guzman also notes a lesion on his left eyebrow and left mandibular margin  Physical Exam    05/23/2023   11:33 AM 05/20/2023    1:41 PM 05/06/2023    6:34 PM  Vitals with BMI   Height 5\' 11"     Weight 143 lbs 3 oz    BMI 19.98    Systolic 128 135 604  Diastolic 85 81 74  Pulse 86 78 86    General:  No acute distress,  Alert and oriented, Non-Toxic, Normal speech and affect Integument: Benign appearing lesions on the forehead and left mandibular margin.  No pain to palpation.  The lesions are soft and not fixed to the tissues surrounding the   Assessment/Plan Skin lesions: Patient has 2 skin lesions on his face that would be readily amenable to excision.  I discussed with him that Adam Guzman will have very small scars and that Adam Guzman may require additional procedures depending on the pathology.  Adam Guzman understands I will send the lesions off for pathologic diagnosis.  Adam Guzman will also have sutures that will need to be removed 5 to 7 days after the procedure.  Photographs were obtained today with his consent.  Will proceed at his request.  Santiago Glad 05/23/2023, 12:59 PM

## 2023-05-23 NOTE — Telephone Encounter (Signed)
Pharmacy calling in to verify antibiotic regimen for the patient. During today's visit the Penicillin was discontinued and amoxicillin sent in.   Confirmed this with the pharmacy and they states they will inform the patient to stop the amoxicillin.

## 2023-05-23 NOTE — ED Triage Notes (Signed)
Patient c/o sore throat and fever since yesterday.  Patent denies taking any medications for his symptoms.

## 2023-05-23 NOTE — Discharge Instructions (Signed)
Take the amoxicillin as prescribed to treat strep throat.  We will contact you if the throat culture shows you need to stop the antibiotic early next week.  Change toothbrush today or tomorrow to prevent reinfection.  Seek care if symptoms do not improve or if they worsen with this treatment.

## 2023-05-26 ENCOUNTER — Emergency Department (HOSPITAL_COMMUNITY)
Admission: EM | Admit: 2023-05-26 | Discharge: 2023-05-26 | Disposition: A | Discharge status: Home / Self Care | Payer: Self-pay | Attending: Emergency Medicine | Admitting: Emergency Medicine

## 2023-05-26 ENCOUNTER — Other Ambulatory Visit: Payer: Self-pay

## 2023-05-26 ENCOUNTER — Encounter (HOSPITAL_COMMUNITY): Payer: Self-pay

## 2023-05-26 DIAGNOSIS — J069 Acute upper respiratory infection, unspecified: Secondary | ICD-10-CM | POA: Insufficient documentation

## 2023-05-26 DIAGNOSIS — L259 Unspecified contact dermatitis, unspecified cause: Secondary | ICD-10-CM | POA: Insufficient documentation

## 2023-05-26 LAB — CULTURE, GROUP A STREP (THRC)

## 2023-05-26 NOTE — ED Triage Notes (Signed)
Pt here for evaluation of cough, congestion and upper respiratory issues. Was seen on Thursday for the same, but reports that it is not getting any better. Also states he has a new rash to his right arm, states it does not hurt or anything.

## 2023-05-26 NOTE — ED Provider Notes (Signed)
[place] EMERGENCY DEPARTMENT AT [facility] Provider Note   CSN: [id number] Arrival date & time: 05/26/23  4098     History  Chief Complaint  Patient presents with   Nasal Congestion   URI   Rash    Adam Guzman is a 27 y.o. male with past medical history of neurofibromatosis presenting to the emergency room with runny nose and cough.  Patient also endorses muscle aches.  Denies any chills or known fever.  Patiently was recently diagnosed with strep throat and started on amoxicillin.  Since starting the amoxicillin his sore throat has greatly improved, he still has some mild sore throat.  Patient was seen here on Thursday reports he is doing much better since however he started experiencing a rash on his right arm where he was wearing his watch.  Patient reports the rash is not red, not itchy and no edema.  No shortness of breath, chest pain, headache abdominal pain.   URI Rash      Home Medications Prior to Admission medications   Medication Sig Start Date End Date Taking? Authorizing Provider  amoxicillin (AMOXIL) 500 MG capsule Take 1 capsule (500 mg total) by mouth 2 (two) times daily for 10 days. 05/23/23 06/02/23  Valentino Nose, NP  diclofenac (VOLTAREN) 50 MG EC tablet Take 1 tablet (50 mg total) by mouth 2 (two) times daily for 10 days. 05/20/23 05/30/23  Jeannie Fend, PA-C      Allergies    Patient has no known allergies.    Review of Systems   Review of Systems  Skin:  Positive for rash.    Physical Exam Updated Vital Signs BP 130/84 (BP Location: Left Arm)   Pulse 88   Temp 98.8 F (37.1 C) (Oral)   Resp 16   Ht 5\' 11"  (1.803 m)   Wt 65 kg   SpO2 99%   BMI 19.99 kg/m  Physical Exam Vitals and nursing note reviewed.  Constitutional:      General: He is not in acute distress.    Appearance: He is not toxic-appearing.  HENT:     Head: Normocephalic and atraumatic.  Eyes:     General: No scleral icterus.    Conjunctiva/sclera:  Conjunctivae normal.  Cardiovascular:     Rate and Rhythm: Normal rate and regular rhythm.     Pulses: Normal pulses.     Heart sounds: Normal heart sounds.  Pulmonary:     Effort: Pulmonary effort is normal. No respiratory distress.     Breath sounds: Normal breath sounds. No stridor. No wheezing or rales.  Abdominal:     General: Abdomen is flat. Bowel sounds are normal.     Palpations: Abdomen is soft.     Tenderness: There is no abdominal tenderness.  Skin:    General: Skin is warm and dry.     Capillary Refill: Capillary refill takes less than 2 seconds.     Findings: Lesion present.     Comments: 2cm x 2cm patch of non - raised dry, flaking skin  Neurological:     General: No focal deficit present.     Mental Status: He is alert and oriented to person, place, and time. Mental status is at baseline.     ED Results / Procedures / Treatments   Labs (all labs ordered are listed, but only abnormal results are displayed) Labs Reviewed - No data to display  EKG None  Radiology No results found.  Procedures Procedures  Medications Ordered in ED Medications - No data to display  ED Course/ Medical Decision Making/ A&P                                 Medical Decision Making  This patient presents to the ED for concern of URI and rash, this involves an extensive number of treatment options, and is a complaint that carries with it a high risk of complications and morbidity.  The differential diagnosis includes contact dermatitis, ringworm, cellulitis, bug bite   Co morbidities that complicate the patient evaluation  Neurofibromatosis     Consultations Obtained:  None   Problem List / ED Course / Critical interventions / Medication management  Reporting to emergency room with runny nose and congestion that started 5 days ago.  Patient reports he was diagnosed with strep throat and started on amoxicillin.  Since starting amoxicillin his symptoms have began to  improve.  Patient has had some residual muscle aches and developed a new rash yesterday. Patient would like treatment for rash and feels that his flulike symptoms are starting to improve and not need treatment.  Patient requesting discharge home without any further workup.  Patient is requesting work note since he is being seen in ER. Stable for discharge, okay to follow-up outpatient.  Given that patient lungs clear to auscultation bilaterally, vital signs have been stable and symptoms are improving, will discharge patient encourage proper hydration.  Gave recommendations for rash.  Reevaluation of the patient after these medicines showed that the patient improved I have reviewed the patients home medicines and have made adjustments as needed   Plan Continue taking amoxicillin prescribed from recent emergency room visit. Alternate Tylenol and ibuprofen for muscle aches.  Remember to stay hydrated. Recommend topical hydrocortisone 1% for rash. F/u w/ PCP in 2-3d to ensure resolution of sx.  Patient was given return precautions. Patient stable for discharge at this time.  Patient educated on sx/dx and verbalized understanding of plan. Return to ER w/ new or worsening sx.          Final Clinical Impression(s) / ED Diagnoses Final diagnoses:  None    Rx / DC Orders ED Discharge Orders     None         Smitty Knudsen, PA-C 05/26/23 1843    Maia Plan, MD 06/04/23 [phone]

## 2023-05-26 NOTE — Discharge Instructions (Signed)
You were seen in the ER for congestion and sore throat.  As you were recently seen for sore throat, make sure you continue taking your antibiotics for strep throat.  I recommend staying hydrated with water, supplement Pedialyte and Gatorade as well. If you continue to have body aches you can alternate Tylenol and Ibuprofen. I recommend bland diet like - apple sauce, rice, toast.  Please follow up with your primary care provider. Return to the ER with new or worsening symptoms.

## 2023-06-17 ENCOUNTER — Ambulatory Visit: Payer: Self-pay | Admitting: Plastic Surgery

## 2023-06-19 ENCOUNTER — Encounter (HOSPITAL_COMMUNITY): Payer: Self-pay | Admitting: Emergency Medicine

## 2023-06-19 ENCOUNTER — Ambulatory Visit (HOSPITAL_COMMUNITY)
Admission: EM | Admit: 2023-06-19 | Discharge: 2023-06-19 | Disposition: A | Discharge status: Home / Self Care | Payer: Self-pay | Attending: Family Medicine | Admitting: Family Medicine

## 2023-06-19 DIAGNOSIS — L700 Acne vulgaris: Secondary | ICD-10-CM

## 2023-06-19 DIAGNOSIS — H538 Other visual disturbances: Secondary | ICD-10-CM

## 2023-06-19 MED ORDER — MINOCYCLINE HCL 100 MG PO CAPS: 100.0000 mg | ORAL_CAPSULE | Freq: Two times a day (BID) | ORAL | 0 refills | Status: DC

## 2023-06-19 NOTE — ED Triage Notes (Signed)
Pt c/o bumps on the left side of his face that he noticed 2 days. Pt states he does have sharp pain on face and he has had acne issues before

## 2023-06-20 NOTE — ED Provider Notes (Signed)
[facility]   [id number] 06/19/23 Arrival Time: 1608  ASSESSMENT & PLAN:  1. Nodular acne   2. Blurry vision, left eye    Mild flare. Meds ordered this encounter  Medications   minocycline (MINOCIN) 100 MG capsule    Sig: Take 1 capsule (100 mg total) by mouth 2 (two) times daily.    Dispense:  28 capsule    Refill:  0   Recommend for blurry left eye:  Follow-up Information     Schedule an appointment as soon as possible for a visit  with Groat, Bertram Millard, MD.   Specialty: Ophthalmology Contact information: [address] [place] [address] [phone]                  Will follow up with PCP or here if worsening or failing to improve as anticipated. Reviewed expectations re: course of current medical issues. Questions answered. Outlined signs and symptoms indicating need for more acute intervention. Patient verbalized understanding. After Visit Summary given.   SUBJECTIVE:  Adam Guzman is a 27 y.o. male who presents with a skin complaint.  Pt c/o bumps on the left side of his face that he noticed 2 days. Pt states he does have sharp pain on face and he has had acne issues before Also intermittent blurry vision L eye; few seconds then clears; cannot remember when he first noted. Denies current symptoms. Denies eye pain.   OBJECTIVE: Vitals:   06/19/23 1716  BP: 120/81  Pulse: 83  Resp: 16  Temp: 98 F (36.7 C)  TempSrc: Oral  SpO2: 99%    General appearance: alert; no distress HEENT: [place]; AT; PERRLA; EOMI Neck: supple with FROM Lungs: clear to auscultation bilaterally Heart: regular rate and rhythm Extremities: no edema; moves all extremities normally Skin: warm and dry; mild nodular acne of face Psychological: alert and cooperative; normal mood and affect  No Known Allergies  Past Medical History:  Diagnosis Date   Neurofibromatosis (HCC)    Social History   Socioeconomic History   Marital status: Single     Spouse name: Not on file   Number of children: Not on file   Years of education: Not on file   Highest education level: Not on file  Occupational History   Not on file  Tobacco Use   Smoking status: Every Day    Current packs/day: 0.50    Types: Cigarettes    Passive exposure: Current   Smokeless tobacco: Never  Vaping Use   Vaping status: Some Days   Substances: Nicotine, Flavoring  Substance and Sexual Activity   Alcohol use: Not Currently    Comment: socially   Drug use: No   Sexual activity: Yes  Other Topics Concern   Not on file  Social History Narrative   Pericles is a high school graduate.   He graduated from [employer].    He lives with his mother, brother, and step-dad.    He enjoys playing video games, riding his bike, watching TV, and parkour.   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

## 2023-06-24 ENCOUNTER — Ambulatory Visit: Payer: Self-pay | Admitting: Plastic Surgery

## 2023-07-17 ENCOUNTER — Ambulatory Visit: Payer: Self-pay | Admitting: Medical Genetics

## 2023-07-22 ENCOUNTER — Ambulatory Visit: Payer: Self-pay | Admitting: Dermatology

## 2023-08-12 ENCOUNTER — Other Ambulatory Visit: Payer: Self-pay

## 2023-08-12 ENCOUNTER — Ambulatory Visit (HOSPITAL_COMMUNITY)
Admission: RE | Admit: 2023-08-12 | Discharge: 2023-08-12 | Disposition: A | Discharge status: Home / Self Care | Payer: Self-pay | Source: Ambulatory Visit | Attending: Family Medicine | Admitting: Family Medicine

## 2023-08-12 ENCOUNTER — Encounter (HOSPITAL_COMMUNITY): Payer: Self-pay

## 2023-08-12 VITALS — BP 152/93 | HR 109 | Temp 98.1°F | Resp 18

## 2023-08-12 DIAGNOSIS — R1030 Lower abdominal pain, unspecified: Secondary | ICD-10-CM | POA: Insufficient documentation

## 2023-08-12 DIAGNOSIS — R739 Hyperglycemia, unspecified: Secondary | ICD-10-CM | POA: Insufficient documentation

## 2023-08-12 DIAGNOSIS — N4889 Other specified disorders of penis: Secondary | ICD-10-CM | POA: Insufficient documentation

## 2023-08-12 LAB — POCT URINALYSIS DIP (MANUAL ENTRY)
Bilirubin, UA: NEGATIVE
Blood, UA: NEGATIVE
Glucose, UA: NEGATIVE mg/dL
Ketones, POC UA: NEGATIVE mg/dL
Leukocytes, UA: NEGATIVE
Nitrite, UA: NEGATIVE
Protein Ur, POC: NEGATIVE mg/dL
Spec Grav, UA: 1.01 (ref 1.010–1.025)
Urobilinogen, UA: 0.2 U/dL
pH, UA: 7 (ref 5.0–8.0)

## 2023-08-12 LAB — POCT FASTING CBG KUC MANUAL ENTRY: POCT Glucose (KUC): 118 mg/dL — AB (ref 70–99)

## 2023-08-12 NOTE — ED Triage Notes (Signed)
Sharp penile pain, urinating frequently, and lower abdomen pain.  Onset of symptoms was last wednesday

## 2023-08-12 NOTE — Discharge Instructions (Addendum)
You have been seen today for abdominal pain. Your evaluation was not suggestive of any emergent condition requiring medical intervention at this time. However, some abdominal problems make take more time to appear. Therefore, it is very important for you to pay attention to any new symptoms or worsening of your current condition.  Please return here or to the Emergency Department immediately should you begin to feel worse in any way or have any of the following symptoms: increasing or different abdominal pain, persistent vomiting, inability to drink fluids, fevers, or shaking chills.   We have sent testing for sexually transmitted infections. We will notify you of any positive results once they are received. If required, we will prescribe any medications you might need.  Please refrain from all sexual activity for at least the next seven days.    

## 2023-08-13 LAB — CYTOLOGY, (ORAL, ANAL, URETHRAL) ANCILLARY ONLY
Chlamydia: NEGATIVE
Comment: NEGATIVE
Comment: NEGATIVE
Comment: NORMAL
Neisseria Gonorrhea: NEGATIVE
Trichomonas: NEGATIVE

## 2023-08-14 NOTE — ED Provider Notes (Signed)
[facility]   [id number] 08/12/23 Arrival Time: 1318  ASSESSMENT & PLAN:  1. Lower abdominal pain   2. Penile pain   3. Elevated blood sugar       Discharge Instructions      You have been seen today for abdominal pain. Your evaluation was not suggestive of any emergent condition requiring medical intervention at this time. However, some abdominal problems make take more time to appear. Therefore, it is very important for you to pay attention to any new symptoms or worsening of your current condition.  Please return here or to the Emergency Department immediately should you begin to feel worse in any way or have any of the following symptoms: increasing or different abdominal pain, persistent vomiting, inability to drink fluids, fevers, or shaking chills.   We have sent testing for sexually transmitted infections. We will notify you of any positive results once they are received. If required, we will prescribe any medications you might need.  Please refrain from all sexual activity for at least the next seven days.      Pending: Labs Reviewed  POCT FASTING CBG [facility] MANUAL ENTRY - Abnormal; Notable for the following components:      Result Value   POCT Glucose (KUC) 118 (*)    All other components within normal limits  POCT URINALYSIS DIP (MANUAL ENTRY)  CYTOLOGY, (ORAL, ANAL, URETHRAL) ANCILLARY ONLY    Follow-up Information     Schedule an appointment as soon as possible for a visit  with Alfredo Martinez, MD.   Specialty: Family Medicine Why: For follow up and to recheck your blood sugar. Contact information: [address] [place] [address] [phone]                Will call with any urethral cytology results.  Reviewed expectations re: course of current medical issues. Questions answered. Outlined signs and symptoms indicating need for more acute intervention. Patient verbalized understanding. After Visit Summary  given.   SUBJECTIVE:  Adam Guzman is a 27 y.o. male who presents with complaint of sharp penile pain, urinating frequently, and lower abdomen discomfort. Gradual onset; noted approx 4-5 d ago; a little better now. Denies specific penile discharge. Abd pain without n/v/d. Normal PO intake. Symptoms not waking him at night. Desires STI screening.   OBJECTIVE:  Vitals:   08/12/23 1341  BP: (!) 152/93  Pulse: (!) 109  Resp: 18  Temp: 98.1 F (36.7 C)  TempSrc: Oral  SpO2: 97%    Recheck P: 92 General appearance: alert, cooperative, appears stated age and no distress Throat: lips, mucosa, and tongue normal; teeth and gums normal Lungs: unlabored respirations; speaks full sentences without difficulty Back: no CVA tenderness; FROM at waist Abdomen: soft, non-tender GU: deferred Skin: warm and dry Psychological: alert and cooperative; normal mood and affect.    Labs Reviewed  POCT FASTING CBG KUC MANUAL ENTRY - Abnormal; Notable for the following components:      Result Value   POCT Glucose (KUC) 118 (*)    All other components within normal limits  POCT URINALYSIS DIP (MANUAL ENTRY)  CYTOLOGY, (ORAL, ANAL, URETHRAL) ANCILLARY ONLY    No Known Allergies  Past Medical History:  Diagnosis Date   Neurofibromatosis (HCC)    Family History  Problem Relation Age of Onset   Cancer Father        Died at 2   Healthy Mother    Social History   Socioeconomic History  Marital status: Single    Spouse name: Not on file   Number of children: Not on file   Years of education: Not on file   Highest education level: Not on file  Occupational History   Not on file  Tobacco Use   Smoking status: Every Day    Current packs/day: 0.50    Types: Cigarettes    Passive exposure: Current   Smokeless tobacco: Never  Vaping Use   Vaping status: Some Days   Substances: Nicotine, Flavoring  Substance and Sexual Activity   Alcohol use: Not Currently    Comment: socially    Drug use: No   Sexual activity: Yes  Other Topics Concern   Not on file  Social History Narrative   Braxen is a high school graduate.   He graduated from [employer].    He lives with his mother, brother, and step-dad.    He enjoys playing video games, riding his bike, watching TV, and parkour.   He is a Airline pilot at [employer] Strain: Not on [employer] Insecurity: Not on file  Transportation Needs: Not on file  Physical Activity: Not on file  Stress: Not on file  Social Connections: Not on file  Intimate Partner Violence: Not on file           Mardella Layman, MD 08/14/23 1123

## 2023-08-15 ENCOUNTER — Encounter: Payer: Self-pay | Admitting: Family Medicine

## 2023-08-15 ENCOUNTER — Ambulatory Visit: Payer: Self-pay

## 2023-08-15 VITALS — BP 137/75 | HR 93 | Temp 97.9°F | Ht 71.0 in | Wt 144.0 lb

## 2023-08-15 DIAGNOSIS — R739 Hyperglycemia, unspecified: Secondary | ICD-10-CM | POA: Insufficient documentation

## 2023-08-15 DIAGNOSIS — R030 Elevated blood-pressure reading, without diagnosis of hypertension: Secondary | ICD-10-CM | POA: Insufficient documentation

## 2023-08-15 DIAGNOSIS — N4889 Other specified disorders of penis: Secondary | ICD-10-CM | POA: Insufficient documentation

## 2023-08-15 DIAGNOSIS — R109 Unspecified abdominal pain: Secondary | ICD-10-CM | POA: Insufficient documentation

## 2023-08-15 DIAGNOSIS — R1030 Lower abdominal pain, unspecified: Secondary | ICD-10-CM

## 2023-08-15 DIAGNOSIS — L02811 Cutaneous abscess of head [any part, except face]: Secondary | ICD-10-CM

## 2023-08-15 LAB — POCT GLYCOSYLATED HEMOGLOBIN (HGB A1C): Hemoglobin A1C: 5 % (ref 4.0–5.6)

## 2023-08-15 NOTE — Assessment & Plan Note (Signed)
CBG 118 at urgent care.  Likely in the setting of eating prior to his visit.  After shared decision making, will check A1c today.

## 2023-08-15 NOTE — Assessment & Plan Note (Signed)
History and comfortability on exam with normal vitals reassuring against UTI, appendicitis, SBO.  However, given he has not stooled in a couple days, he is likely constipated, which is contributing to presentation.  Recommended MiraLAX 1 capful daily can go up to 2 capfuls daily as needed.  Follow-up in 1 month.

## 2023-08-15 NOTE — Progress Notes (Signed)
    SUBJECTIVE:   CHIEF COMPLAINT / HPI:   Abdominal pain and penile pain Recently seen in urgent care on 12/16 for this.  UA collected at that time which was normal.  Gonorrhea, chlamydia, and trichomonas testing was also negative.  He has not appreciated any lesions, drainage, or bleeding around the penis.  No dysuria.  He is sexually active with 1 partner of 6 months.  Pain is sharp at the tip of the penis and is intermittent.  His abdominal pain is also intermittent.  He has not stooled in multiple days, and he has a history of constipation.  He can hear his stomach gurgling at times which is when he notices pain, too.  Elevated blood glucose Blood glucose at urgent care was 118.  He was told this was abnormal and has been worried about this.  He did eat before this was taken at the urgent care.  PERTINENT  PMH / PSH: NF1, gynecomastia, migraines  OBJECTIVE:   BP 137/75   Pulse 93   Temp 97.9 F (36.6 C)   Ht 5\' 11"  (1.803 m)   Wt 144 lb (65.3 kg)   SpO2 100%   BMI 20.08 kg/m   General: Alert and oriented, in NAD Skin: Warm, dry, and intact HEENT: NCAT, EOM grossly normal, midline nasal septum Cardiac: Regular rate Respiratory: Breathing and speaking comfortably on RA Abdominal: Nondistended, no pain with movement Extremities: Moves all extremities grossly equally Genitourinary: Glans penis without erythema, lesions, or discharge; bilateral testes without swelling or tenderness to palpation Neurological: No gross focal deficit Psychiatric: Appropriate mood and affect   ASSESSMENT/PLAN:   Abdominal pain History and comfortability on exam with normal vitals reassuring against UTI, appendicitis, SBO.  However, given he has not stooled in a couple days, he is likely constipated, which is contributing to presentation.  Recommended MiraLAX 1 capful daily can go up to 2 capfuls daily as needed.  Follow-up in 1 month.  Penile pain Reassuringly without signs of infection with  negative urinary studies.  Discussed possibility of referred pain from the abdomen versus local irritation from loosefitting boxers.  Recommended bowel regimen as above.  Also recommended using more fitted underwear such as boxer briefs.  Follow-up in 1 month.  Elevated BP without diagnosis of hypertension Mildly elevated today.  Elevated to 152/93 urgent care.  Previously normal back in October 2024.  Feel understandable anxiety around health is playing a component.  Follow-up in 1 month to assess when not acutely anxious.  Elevated random blood glucose level CBG 118 at urgent care.  Likely in the setting of eating prior to his visit.  After shared decision making, will check A1c today.   Janeal Holmes, MD [facility]

## 2023-08-15 NOTE — Patient Instructions (Signed)
I recommend using MiraLAX at least 1 capful daily.  You can go up to 2 capfuls daily if you are not stooling regularly.  Let me know if you are still having problems.  Your penis pain could be due to referred pain from the abdomen but it could also be rubbing up against the boxers.  Reassuringly no signs of infection.  I recommend wearing more supportive boxers and let me know if this worsens.  Come back in 1 month for follow-up of the blood pressure as as well as this pain.

## 2023-08-15 NOTE — Assessment & Plan Note (Signed)
Reassuringly without signs of infection with negative urinary studies.  Discussed possibility of referred pain from the abdomen versus local irritation from loosefitting boxers.  Recommended bowel regimen as above.  Also recommended using more fitted underwear such as boxer briefs.  Follow-up in 1 month.

## 2023-08-15 NOTE — Assessment & Plan Note (Signed)
Mildly elevated today.  Elevated to 152/93 urgent care.  Previously normal back in October 2024.  Feel understandable anxiety around health is playing a component.  Follow-up in 1 month to assess when not acutely anxious.

## 2023-08-31 ENCOUNTER — Encounter (HOSPITAL_COMMUNITY): Payer: Self-pay

## 2023-08-31 ENCOUNTER — Ambulatory Visit (HOSPITAL_COMMUNITY)
Admission: EM | Admit: 2023-08-31 | Discharge: 2023-08-31 | Disposition: A | Discharge status: Home / Self Care | Payer: Self-pay | Attending: Physician Assistant | Admitting: Physician Assistant

## 2023-08-31 DIAGNOSIS — N489 Disorder of penis, unspecified: Secondary | ICD-10-CM | POA: Insufficient documentation

## 2023-08-31 NOTE — ED Provider Notes (Addendum)
 [facility]    CSN: [id number] Arrival date & time: 08/31/23  1112      History   Chief Complaint Chief Complaint  Patient presents with   Abrasion    Scab on private area    HPI Adam Guzman is a 28 y.o. male.   Patient complains of a small lesion on his penis that started about 2 days ago.  He reports it is mildly uncomfortable but denies burning or itching.  He denies penile discharge, dysuria, abdominal pain, fever, chills.  He has never had a similar lesion in the past.  Patient is sexually active.    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    Past Surgical History:  Procedure Laterality Date   WISDOM TOOTH EXTRACTION         Home Medications    Prior to Admission medications   Not on File    Family History Family History  Problem Relation Age of Onset   Cancer Father        Died at 24   Healthy Mother     Social History Social History   Tobacco Use   Smoking status: Every Day    Current packs/day: 0.50    Types: Cigarettes    Passive exposure: Current   Smokeless tobacco: Never  Vaping Use   Vaping status: Some Days   Substances: Nicotine, Flavoring  Substance Use Topics   Alcohol use: Not Currently    Comment: socially   Drug use: No     Allergies   Patient has no known allergies.   Review of Systems Review of Systems  Constitutional:  Negative for chills and fever.  HENT:  Negative for  ear pain and sore throat.   Eyes:  Negative for pain and visual disturbance.  Respiratory:  Negative for cough and shortness of breath.   Cardiovascular:  Negative for chest pain and palpitations.  Gastrointestinal:  Negative for abdominal pain and vomiting.  Genitourinary:  Negative for dysuria and hematuria.  Musculoskeletal:  Negative for arthralgias and back pain.  Skin:  Negative for color change and rash.  Neurological:  Negative for seizures and syncope.  All other systems reviewed and are negative.    Physical Exam Triage Vital Signs ED Triage Vitals  Encounter Vitals Group     BP 08/31/23 1252 (!) 142/82     Systolic BP Percentile --      Diastolic BP Percentile --      Pulse Rate 08/31/23 1252 93     Resp 08/31/23 1252 16     Temp 08/31/23 1252 98.1 F (36.7 C)     Temp Source 08/31/23 1252 Oral     SpO2 08/31/23 1252 99 %     Weight 08/31/23 1252 143 lb 4.8 oz (65 kg)     Height 08/31/23 1252 5' 11 (1.803 m)     Head Circumference --      Peak Flow --  Pain Score 08/31/23 1251 4     Pain Loc --      Pain Education --      Exclude from Growth Chart --    No data found.  Updated Vital Signs BP (!) 142/82 (BP Location: Right Arm)   Pulse 93   Temp 98.1 F (36.7 C) (Oral)   Resp 16   Ht 5' 11 (1.803 m)   Wt 143 lb 4.8 oz (65 kg)   SpO2 99%   BMI 19.99 kg/m   Visual Acuity Right Eye Distance:   Left Eye Distance:   Bilateral Distance:    Right Eye Near:   Left Eye Near:    Bilateral Near:     Physical Exam Vitals and nursing note reviewed.  Constitutional:      General: He is not in acute distress.    Appearance: He is well-developed.  HENT:     Head: Normocephalic and atraumatic.  Eyes:     Conjunctiva/sclera: Conjunctivae normal.  Cardiovascular:     Rate and Rhythm: Normal rate and regular rhythm.     Heart sounds: No murmur heard. Pulmonary:     Effort: Pulmonary effort is normal. No respiratory distress.     Breath sounds:  Normal breath sounds.  Abdominal:     Palpations: Abdomen is soft.     Tenderness: There is no abdominal tenderness.  Genitourinary:   Musculoskeletal:        General: No swelling.     Cervical back: Neck supple.  Skin:    General: Skin is warm and dry.     Capillary Refill: Capillary refill takes less than 2 seconds.  Neurological:     Mental Status: He is alert.  Psychiatric:        Mood and Affect: Mood normal.      UC Treatments / Results  Labs (all labs ordered are listed, but only abnormal results are displayed) Labs Reviewed  HSV CULTURE AND TYPING    EKG   Radiology No results found.  Procedures Procedures (including critical care time)  Medications Ordered in UC Medications - No data to display  Initial Impression / Assessment and Plan / UC Course  I have reviewed the triage vital signs and the nursing notes.  Pertinent labs & imaging results that were available during my care of the patient were reviewed by me and considered in my medical decision making (see chart for details).     Penile lesion, herpes versus abrasion.  Patient reports it is possible it could be a small cut.  Advised bacitracin ointment and HSV swab done in clinic today.  Will change treatment plan if indicated based on testing results.  Patient advised abstain from sexual activity until test results are back and any necessary treatment is started and lesion has resolved Final Clinical Impressions(s) / UC Diagnoses   Final diagnoses:  Penile lesion     Discharge Instructions      Will call with test results Abstain from sexual intercourse until test results are back and any necessary treatment initiated   ED Prescriptions   None    PDMP not reviewed this encounter.   Ward, Harlene PEDLAR, PA-C 08/31/23 1319    Ward, Harlene PEDLAR, PA-C 08/31/23 1332

## 2023-08-31 NOTE — ED Triage Notes (Addendum)
 Patient here today with c/o a scab on the glans of the penis X 2 days.

## 2023-08-31 NOTE — Discharge Instructions (Addendum)
 Will call with test results Abstain from sexual intercourse until test results are back and any necessary treatment initiated

## 2023-09-02 ENCOUNTER — Ambulatory Visit: Payer: Self-pay | Admitting: Student

## 2023-09-02 NOTE — Progress Notes (Deleted)
  SUBJECTIVE:   CHIEF COMPLAINT / HPI:   Penile concern:  PERTINENT  PMH / PSH: NF1  OBJECTIVE:  There were no vitals taken for this visit. Physical Exam   ASSESSMENT/PLAN:   Assessment & Plan  No follow-ups on file. Kieth Johnson, DO 09/02/2023, 1:15 PM PGY-3, [facility] {    This will disappear when note is signed, click to select method of visit    :1}

## 2023-09-03 LAB — HSV CULTURE AND TYPING

## 2023-09-05 NOTE — Telephone Encounter (Signed)
 Spoke with patient.   He reports he noticed a knot on the back of his head he noticed ~2 days ago.   He reports it is tender to touch and reports the area feels hard like a rock.  He denies any fevers or chills. He is unsure if the area is able to express any drainage.   Patient scheduled for tomorrow morning.

## 2023-09-06 ENCOUNTER — Ambulatory Visit (INDEPENDENT_AMBULATORY_CARE_PROVIDER_SITE_OTHER): Payer: Self-pay

## 2023-09-06 ENCOUNTER — Other Ambulatory Visit (HOSPITAL_COMMUNITY): Payer: Self-pay

## 2023-09-06 VITALS — BP 132/78 | HR 101 | Temp 97.5°F | Ht 71.0 in | Wt 142.5 lb

## 2023-09-06 DIAGNOSIS — L739 Follicular disorder, unspecified: Secondary | ICD-10-CM

## 2023-09-06 DIAGNOSIS — L02811 Cutaneous abscess of head [any part, except face]: Secondary | ICD-10-CM

## 2023-09-06 DIAGNOSIS — L219 Seborrheic dermatitis, unspecified: Secondary | ICD-10-CM

## 2023-09-06 MED ORDER — KETOCONAZOLE-SALICYLIC ACID 2-2 % EX SHAM
1.0000 | MEDICATED_SHAMPOO | CUTANEOUS | 2 refills | Status: DC
Start: 2023-09-06 — End: 2023-09-06
  Filled 2023-09-06: qty 120, fill #0

## 2023-09-06 MED ORDER — KETOCONAZOLE 2 % EX SHAM
1.0000 | MEDICATED_SHAMPOO | CUTANEOUS | 0 refills | Status: DC
Start: 2023-09-09 — End: 2023-11-08
  Filled 2023-09-06: qty 120, 28d supply, fill #0

## 2023-09-06 NOTE — Progress Notes (Signed)
    SUBJECTIVE:   CHIEF COMPLAINT / HPI:   Patient noticed a tender pump at the nape of his neck for the last 3 days. He didn't notice any nicks or cuts there before this but he did get a hair cut that was closely shaven in that area a week ago. No fevers or chills. Bump has been going down on its own and is less tender.   Has never had a bump like this before. No drainage.   Also noticed a smaller painful bump that is a little itchy a little bit higher on the scalp.   Patient says he has had a history of dandruff and used to use head and shoulders to help this.    PERTINENT  PMH / PSH: Neurofibromatosis, tobacco use   OBJECTIVE:   BP 132/78   Pulse (!) 101   Temp (!) 97.5 F (36.4 C) (Oral)   Ht 5' 11 (1.803 m)   Wt 142 lb 8 oz (64.6 kg)   SpO2 98%   BMI 19.87 kg/m   General: well appearing, in no acute distress CV: mildly tachycardic, but regular , radial pulses equal and palpable, no BLE edema  Resp: Normal work of breathing on room air Derm: large 3 cm area of fluctuance on left nape of neck and scalp. Mildly tender to palpation, very slightly warm, no central pore or drainage with attempt to express.  Middle of occipital area of scalp patient had small 1 cm erythematous papule at base of a follicle  Flaky patches throughout the scalp.   ASSESSMENT/PLAN:   Assessment & Plan Scalp abscess Fluctuant area most likely is an abscess that is resolving as it is no longer taut and pain is resolving. No drainage visible and unable to express in clinic. Tachycardia likely due to nervousness regarding drainage rather than systemic infection.   No need to I&D as it is resolving on its own.  No need for antibiotics for same reason.  - If lesion does not resolve in the next week follow up for abx  - Counseled regarding tobacco use as a risk factor for skin infections.  Folliculitis Patient has evidence of folliculitis on the scalp most likely secondary to underlying seborrheic  dermatitis. Counseled against close shaves to the skin for haircuts.  - Treat underlying seborrheic dermatitis  and can use ketoconazole  shampoo to also help as an antiinflammatory agent.  - if it does not improve can also do topical steroid.  Seborrheic dermatitis of scalp Long standing seborrheic dermatitis. Large flakes appear more fungal than dry skin.  - Ketoconazole  shampoo  - Follow up in 3 months       Areta Saliva, MD [facility]

## 2023-09-06 NOTE — Patient Instructions (Signed)
 It was wonderful to see you today.  Please bring ALL of your medications with you to every visit.   Today we talked about:  Bump on the nape of neck probably caused by bacteria entering through a nick in the skin after your haircut. It seems to be going down on its own so we will avoid antibiotics now.   You also have some dandruff that has caused a folliculitis. I have prescribed a shampoo but if you cannot afford this you can buy a ketoconazole  shampoo over the counter. Here is one example.      Thank you for choosing [facility].   Please call [phone] with any questions about today's appointment.   Areta Saliva, MD  Family Medicine

## 2023-09-09 ENCOUNTER — Encounter: Payer: Self-pay | Admitting: Family Medicine

## 2023-09-09 DIAGNOSIS — L02811 Cutaneous abscess of head [any part, except face]: Secondary | ICD-10-CM | POA: Insufficient documentation

## 2023-09-09 DIAGNOSIS — L219 Seborrheic dermatitis, unspecified: Secondary | ICD-10-CM | POA: Insufficient documentation

## 2023-09-09 MED ORDER — CEPHALEXIN 500 MG PO CAPS
500.0000 mg | ORAL_CAPSULE | Freq: Two times a day (BID) | ORAL | 0 refills | Status: AC
Start: 2023-09-09 — End: 2023-09-14

## 2023-09-09 NOTE — Assessment & Plan Note (Signed)
 Patient has evidence of folliculitis on the scalp most likely secondary to underlying seborrheic dermatitis. Counseled against close shaves to the skin for haircuts.  - Treat underlying seborrheic dermatitis  and can use ketoconazole  shampoo to also help as an antiinflammatory agent.  - if it does not improve can also do topical steroid.

## 2023-09-09 NOTE — Assessment & Plan Note (Signed)
 Fluctuant area most likely is an abscess that is resolving as it is no longer taut and pain is resolving. No drainage visible and unable to express in clinic. Tachycardia likely due to nervousness regarding drainage rather than systemic infection.   No need to I&D as it is resolving on its own.  No need for antibiotics for same reason.  - If lesion does not resolve in the next week follow up for abx  - Counseled regarding tobacco use as a risk factor for skin infections.

## 2023-09-09 NOTE — Assessment & Plan Note (Signed)
 Long standing seborrheic dermatitis. Large flakes appear more fungal than dry skin.  - Ketoconazole shampoo  - Follow up in 3 months

## 2023-09-09 NOTE — Addendum Note (Signed)
 Addended by: Lockie Mola on: 09/09/2023 03:43 PM   Modules accepted: Orders

## 2023-09-10 ENCOUNTER — Ambulatory Visit: Payer: Self-pay | Admitting: Student

## 2023-09-17 ENCOUNTER — Ambulatory Visit: Payer: Self-pay | Admitting: Family Medicine

## 2023-09-18 ENCOUNTER — Other Ambulatory Visit (HOSPITAL_COMMUNITY): Payer: Self-pay

## 2023-09-18 ENCOUNTER — Ambulatory Visit: Payer: Self-pay | Admitting: Medical Genetics

## 2023-10-09 ENCOUNTER — Ambulatory Visit: Payer: Self-pay | Admitting: Student

## 2023-10-15 ENCOUNTER — Ambulatory Visit (INDEPENDENT_AMBULATORY_CARE_PROVIDER_SITE_OTHER): Payer: Self-pay | Admitting: Family Medicine

## 2023-10-15 ENCOUNTER — Telehealth: Payer: Self-pay | Admitting: *Deleted

## 2023-10-15 ENCOUNTER — Encounter: Payer: Self-pay | Admitting: Family Medicine

## 2023-10-15 VITALS — BP 132/85 | HR 100 | Temp 98.8°F | Ht 71.0 in | Wt 145.4 lb

## 2023-10-15 DIAGNOSIS — Z5971 Insufficient health insurance coverage: Secondary | ICD-10-CM

## 2023-10-15 DIAGNOSIS — R1030 Lower abdominal pain, unspecified: Secondary | ICD-10-CM

## 2023-10-15 DIAGNOSIS — J029 Acute pharyngitis, unspecified: Secondary | ICD-10-CM

## 2023-10-15 LAB — POCT SEDIMENTATION RATE: POCT SED RATE: 0 mm/h (ref 0–22)

## 2023-10-15 LAB — POC SOFIA 2 FLU + SARS ANTIGEN FIA
Influenza A, POC: NEGATIVE
Influenza B, POC: NEGATIVE
SARS Coronavirus 2 Ag: NEGATIVE

## 2023-10-15 MED ORDER — DICYCLOMINE HCL 10 MG PO CAPS: 10.0000 mg | ORAL_CAPSULE | Freq: Three times a day (TID) | ORAL | 0 refills | Status: DC | PRN

## 2023-10-15 NOTE — Patient Instructions (Addendum)
 It was great to see you! Thank you for allowing me to participate in your care!  Our plans for today:  - I will let you know the results of you lab work. - You may take Bentyl as need to help with your stomach pain. - You should receive a call with in the week to help get you set up with insurance.   Please arrive 15 minutes PRIOR to your next scheduled appointment time! If you do not, this affects OTHER patients' care.  Take care and seek immediate care sooner if you develop any concerns.   Celine Mans, MD, PGY-2 [facility] 9:26 AM 10/15/2023  [facility]

## 2023-10-15 NOTE — Progress Notes (Signed)
    SUBJECTIVE:   CHIEF COMPLAINT / HPI: abdominal pain  Treated with Miralax for constipation by Dr. Phineas Real on 08/15/23.  Stomach "growling" for 2 weeks. Has stomach pain in bilateral lower quadrants. Constant pain. Bloated pain. 6/10 severity pain. No alleviating factors. Stools have been softer than normal, not liquid. No blood in the stool. No vomiting. No nausea or heartburn. No dysuria or frequency.  Has not seen stomach doctor before. NO family hx of IBD or IBS. No recent travel. No weight loss. Eats fast food, greasy foods.  Runny nose  Body aches Sore throat Started Sunday morning NO known fevers Eating and drinking normal No difficulty breathing.  PERTINENT  PMH / PSH: NF1  OBJECTIVE:   BP 132/85   Pulse 100   Temp 98.8 F (37.1 C) (Oral)   Ht 5\' 11"  (1.803 m)   Wt 145 lb 6 oz (65.9 kg)   SpO2 97%   BMI 20.28 kg/m   General: NAD, well appearing HEENT: mild cervical lymphadenopathy, MMM, mild posterior oropharyngeal erythema Neuro: A&O Cardiovascular: RRR, no murmurs, no peripheral edema Respiratory: normal WOB on RA, CTAB, no wheezes, ronchi or rales Abdomen: soft, mild bilateral lower quadrant tenderness, no rebound or guarding Extremities: Moving all 4 extremities equally   ASSESSMENT/PLAN:   Assessment & Plan Lower abdominal pain Chronic, unclear cause. NF1 abdominal tumors vs IBD vs IBS vs Celiac. Preliminary lab work-up today. Reassuringly, no weight loss or hematochezia. -CBC w/diff -CMP -Ferritin, TIBC, Iron -ESR -Celiac panel -Lipase -Bentyl prn for pain Sore throat Likely viral pharyngitis. No indications of bacterial infection. Supportive care discussed. Flu and Covid negative. Does not have health insurance -Referral to [facility].  Return in about 4 weeks (around 11/12/2023) for Abd pain f/u.  Celine Mans, MD [facility]

## 2023-10-15 NOTE — Progress Notes (Signed)
 Complex Care Management Note  Care Guide Note 10/15/2023 Name: Adam Guzman MRN: [id number] DOB: [birth year]  Adam Guzman is a 29 y.o. year old male who sees Alfredo Martinez, MD for primary care. I reached out to Jaquane L Blakeney by phone today to offer complex care management services.  Mr. Hazel was given information about Complex Care Management services today including:   The Complex Care Management services include support from the care team which includes your Nurse Care Manager, Clinical Social Worker, or Pharmacist.  The Complex Care Management team is here to help remove barriers to the health concerns and goals most important to you. Complex Care Management services are voluntary, and the patient may decline or stop services at any time by request to their care team member.   Complex Care Management Consent Status: Patient agreed to services and verbal consent obtained.   Follow up plan:  Telephone appointment with complex care management team member scheduled for:  2/20  Encounter Outcome:  Patient Scheduled  Gwenevere Ghazi  [facility]  [facility] Guide  Direct Dial: [phone]  Fax [phone]

## 2023-10-15 NOTE — Assessment & Plan Note (Addendum)
 Chronic, unclear cause. NF1 abdominal tumors vs IBD vs IBS vs Celiac. Preliminary lab work-up today. Reassuringly, no weight loss or hematochezia. -CBC w/diff -CMP -Ferritin, TIBC, Iron -ESR -Celiac panel -Lipase -Bentyl prn for pain

## 2023-10-16 ENCOUNTER — Encounter: Payer: Self-pay | Admitting: Student

## 2023-10-16 LAB — COMPREHENSIVE METABOLIC PANEL
ALT: 14 [IU]/L (ref 0–44)
AST: 22 [IU]/L (ref 0–40)
Albumin: 4.8 g/dL (ref 4.3–5.2)
Alkaline Phosphatase: 84 [IU]/L (ref 44–121)
BUN/Creatinine Ratio: 4 — ABNORMAL LOW (ref 9–20)
BUN: 4 mg/dL — ABNORMAL LOW (ref 6–20)
Bilirubin Total: 0.8 mg/dL (ref 0.0–1.2)
CO2: 22 mmol/L (ref 20–29)
Calcium: 9.9 mg/dL (ref 8.7–10.2)
Chloride: 99 mmol/L (ref 96–106)
Creatinine, Ser: 0.93 mg/dL (ref 0.76–1.27)
Globulin, Total: 2.8 g/dL (ref 1.5–4.5)
Glucose: 73 mg/dL (ref 70–99)
Potassium: 5.1 mmol/L (ref 3.5–5.2)
Sodium: 142 mmol/L (ref 134–144)
Total Protein: 7.6 g/dL (ref 6.0–8.5)
eGFR: 117 mL/min/{1.73_m2} (ref >59)

## 2023-10-16 LAB — CBC WITH DIFFERENTIAL/PLATELET
Basophils Absolute: 0 10*3/uL (ref 0.0–0.2)
Basos: 0 %
EOS (ABSOLUTE): 0 10*3/uL (ref 0.0–0.4)
Eos: 1 %
Hematocrit: 54.3 % — ABNORMAL HIGH (ref 37.5–51.0)
Hemoglobin: 18.4 g/dL — ABNORMAL HIGH (ref 13.0–17.7)
Immature Grans (Abs): 0 10*3/uL (ref 0.0–0.1)
Immature Granulocytes: 0 %
Lymphocytes Absolute: 0.8 10*3/uL (ref 0.7–3.1)
Lymphs: 17 %
MCH: 27.2 pg (ref 26.6–33.0)
MCHC: 33.9 g/dL (ref 31.5–35.7)
MCV: 80 fL (ref 79–97)
Monocytes Absolute: 1 10*3/uL — ABNORMAL HIGH (ref 0.1–0.9)
Monocytes: 21 %
Neutrophils Absolute: 2.9 10*3/uL (ref 1.4–7.0)
Neutrophils: 61 %
Platelets: 160 10*3/uL (ref 150–450)
RBC: 6.76 x10E6/uL — ABNORMAL HIGH (ref 4.14–5.80)
RDW: 14.1 % (ref 11.6–15.4)
WBC: 4.8 10*3/uL (ref 3.4–10.8)

## 2023-10-16 LAB — IRON,TIBC AND FERRITIN PANEL
Ferritin: 159 ng/mL (ref 30–400)
Iron Saturation: 15 % (ref 15–55)
Iron: 57 ug/dL (ref 38–169)
Total Iron Binding Capacity: 368 ug/dL (ref 250–450)
UIBC: 311 ug/dL (ref 111–343)

## 2023-10-16 LAB — CELIAC DISEASE COMPREHENSIVE PANEL WITH REFLEXES
IgA/Immunoglobulin A, Serum: 173 mg/dL (ref 90–386)
Transglutaminase IgA: <2 U/mL (ref 0–3)

## 2023-10-16 LAB — LIPASE: Lipase: 23 U/L (ref 13–78)

## 2023-10-17 ENCOUNTER — Ambulatory Visit: Payer: Self-pay | Admitting: Licensed Clinical Social Worker

## 2023-10-17 NOTE — Patient Outreach (Signed)
 Care Coordination   Initial Visit Note   10/17/2023 Name: Adam Guzman MRN: [id number] DOB: May 30, 1996  Adam Guzman is a 28 y.o. year old male who sees Alfredo Martinez, MD for primary care. I spoke with  Adam Guzman by phone today.  What matters to the patients health and wellness today?  Pt needed information to apply for [place] [employer]. Patient provided with web address and requirement for applying. Pt expressed understanding and reports no further concerns.     Goals Addressed   None     SDOH assessments and interventions completed:  Yes  SDOH Interventions Today    Flowsheet Row Most Recent Value  SDOH Interventions   Food Insecurity Interventions Intervention Not Indicated  Housing Interventions Intervention Not Indicated  Transportation Interventions Intervention Not Indicated  Utilities Interventions Intervention Not Indicated        Care Coordination Interventions:  Yes, provided  Interventions Today    Flowsheet Row Most Recent Value  General Interventions   General Interventions Discussed/Reviewed Community Resources  [Encourage pt to apply for [place] medicaid via online [place].gov]       Follow up plan: No further intervention required.   Encounter Outcome:  Patient Visit Completed   Gwyndolyn Saxon MSW, LCSW Licensed Clinical Social Worker  [facility], Population Health Direct Dial: [phone]  Fax: [phone]

## 2023-10-17 NOTE — Patient Instructions (Signed)
 Visit Information  Thank you for taking time to visit with me today. Please don't hesitate to contact me if I can be of assistance to you.   Following are the goals we discussed today:   Goals Addressed   None        Please call the care guide team at [phone] if you need to cancel or reschedule your appointment.   If you are experiencing a Mental Health or Behavioral Health Crisis or need someone to talk to, please call 911   Patient verbalizes understanding of instructions and care plan provided today and agrees to view in MyChart. Active MyChart status and patient understanding of how to access instructions and care plan via MyChart confirmed with patient.     The patient has been provided with contact information for the care management team and has been advised to call with any health related questions or concerns.   Gwyndolyn Saxon MSW, LCSW Licensed Clinical Social Worker  [facility], Population Health Direct Dial: [phone]  Fax: [phone]

## 2023-10-18 NOTE — Telephone Encounter (Signed)
 Spoke with patient. He stated that his symptoms are getting a little worse and wanted a earlier appt. Made him an appt for 2/25. Aquilla Solian, CMA

## 2023-10-22 ENCOUNTER — Ambulatory Visit: Payer: Self-pay | Admitting: Student

## 2023-10-23 ENCOUNTER — Ambulatory Visit (INDEPENDENT_AMBULATORY_CARE_PROVIDER_SITE_OTHER): Payer: Self-pay

## 2023-10-23 VITALS — BP 143/84 | HR 95 | Ht 71.0 in | Wt 150.2 lb

## 2023-10-23 DIAGNOSIS — D751 Secondary polycythemia: Secondary | ICD-10-CM

## 2023-10-23 DIAGNOSIS — R1033 Periumbilical pain: Secondary | ICD-10-CM

## 2023-10-23 LAB — POCT SEDIMENTATION RATE: POCT SED RATE: 0 mm/h (ref 0–22)

## 2023-10-23 NOTE — Addendum Note (Signed)
 Addended by: Jennette Bill on: 10/23/2023 11:23 AM   Modules accepted: Orders

## 2023-10-23 NOTE — Progress Notes (Signed)
    SUBJECTIVE:   CHIEF COMPLAINT / HPI:   Abdominal pain Previously seen on 10/15/2023 for lower abdominal pain.  At that time, patient reports that stomach was "growling" for 2 weeks.  Pain was in the bilateral lower quadrants.  Patient reports chronic diarrhea.  No change in water source, no travel. Today, pain has not improved.  No nausea, vomiting, dysuria, fever, heartburn.  Sexually active, but denies penile pain/discharge.  No weight loss.  Does not notice any difference between the foods he eats and his pain.  Pain is constant. Lab work completed on 2/18 fairly unremarkable but did show polycythemia (is a smoker).  PERTINENT  PMH / PSH: NF1  OBJECTIVE:   BP (!) 143/84   Pulse 95   Ht 5\' 11"  (1.803 m)   Wt 150 lb 3.2 oz (68.1 kg)   SpO2 100%   BMI 20.95 kg/m    **Chaperone Desiree, CMA present during GU exam**  General: NAD, pleasant Cardio: RRR, no MRG. Cap Refill <2s. Respiratory: CTAB, normal wob on RA GI: Abdomen is soft, mild TTP in epigastric/periumbilical (predominantly left side) region.  Carnett's sign negative.  Did not appreciate hepatosplenomegaly.  No abdominal wall hernia. GU: No evidence of inguinal hernia, no testicular pain, normal circumcised penis, normal testicles and scrotum. Skin: Warm and dry  ASSESSMENT/PLAN:   Assessment & Plan Periumbilical abdominal pain Pain predominantly periumbilical (worse on left).  Previous lab work unremarkable including transglutaminase IgA/lipase (aside from polycythemia).  With chronic diarrhea differential includes: IBD/IBS, food allergy. - Hep C, ESR, fecal calprotectin - Referral to GI for consideration of EGD/colonoscopy v. CT abdomen Polycythemia Smoking history.  Previously normal 6 years ago. - CBC with differential - Consider sleep apnea screening at next visit   Tiffany Kocher, DO [facility]

## 2023-10-23 NOTE — Assessment & Plan Note (Signed)
 Pain predominantly periumbilical (worse on left).  Previous lab work unremarkable including transglutaminase IgA/lipase (aside from polycythemia).  With chronic diarrhea differential includes: IBD/IBS, food allergy. - Hep C, ESR, fecal calprotectin - Referral to GI for consideration of EGD/colonoscopy v. CT abdomen

## 2023-10-23 NOTE — Patient Instructions (Addendum)
 It was great to see you! Thank you for allowing me to participate in your care!   I recommend that you always bring your medications to each appointment as this makes it easy to ensure we are on the correct medications and helps [place] not miss when refills are needed.  Our plans for today:  - We have sent a referral to GI, they will call you for an appointment - We are checking some labs today, I will call you if they are abnormal will send you a MyChart message or a letter if they are normal.  If you do not hear about your labs in the next 2 weeks please let us know.  Take care and seek immediate care sooner if you develop any concerns. Please remember to show up 15 minutes before your scheduled appointment time!  Tiffany Kocher, DO [facility] Family Medicine

## 2023-10-24 ENCOUNTER — Encounter: Payer: Self-pay | Admitting: Student

## 2023-10-24 LAB — CBC WITH DIFFERENTIAL/PLATELET
Basophils Absolute: 0 x10E3/uL (ref 0.0–0.2)
Basos: 1 %
EOS (ABSOLUTE): 0.1 x10E3/uL (ref 0.0–0.4)
Eos: 3 %
Hematocrit: 51.5 % — ABNORMAL HIGH (ref 37.5–51.0)
Hemoglobin: 16.9 g/dL (ref 13.0–17.7)
Immature Grans (Abs): 0 x10E3/uL (ref 0.0–0.1)
Immature Granulocytes: 0 %
Lymphocytes Absolute: 1.3 x10E3/uL (ref 0.7–3.1)
Lymphs: 38 %
MCH: 26.8 pg (ref 26.6–33.0)
MCHC: 32.8 g/dL (ref 31.5–35.7)
MCV: 82 fL (ref 79–97)
Monocytes Absolute: 0.4 x10E3/uL (ref 0.1–0.9)
Monocytes: 13 %
Neutrophils Absolute: 1.5 x10E3/uL (ref 1.4–7.0)
Neutrophils: 45 %
Platelets: 264 x10E3/uL (ref 150–450)
RBC: 6.3 x10E6/uL — ABNORMAL HIGH (ref 4.14–5.80)
RDW: 13.8 % (ref 11.6–15.4)
WBC: 3.4 x10E3/uL (ref 3.4–10.8)

## 2023-10-24 LAB — HEPATITIS C ANTIBODY: Hep C Virus Ab: NONREACTIVE

## 2023-10-29 ENCOUNTER — Ambulatory Visit: Payer: Self-pay | Admitting: Medical Genetics

## 2023-11-08 ENCOUNTER — Ambulatory Visit (HOSPITAL_COMMUNITY)
Admission: RE | Admit: 2023-11-08 | Discharge: 2023-11-08 | Disposition: A | Discharge status: Home / Self Care | Payer: Self-pay | Source: Ambulatory Visit | Attending: Family Medicine | Admitting: Family Medicine

## 2023-11-08 ENCOUNTER — Other Ambulatory Visit: Payer: Self-pay

## 2023-11-08 ENCOUNTER — Encounter (HOSPITAL_COMMUNITY): Payer: Self-pay

## 2023-11-08 VITALS — BP 129/88 | HR 90 | Temp 98.1°F | Resp 18

## 2023-11-08 DIAGNOSIS — L739 Follicular disorder, unspecified: Secondary | ICD-10-CM

## 2023-11-08 MED ORDER — CEPHALEXIN 500 MG PO CAPS: 500.0000 mg | ORAL_CAPSULE | Freq: Two times a day (BID) | ORAL | 0 refills | Status: AC

## 2023-11-08 NOTE — ED Provider Notes (Signed)
 [facility]    CSN: [id number] Arrival date & time: 11/08/23  2952      History   Chief Complaint Chief Complaint  Patient presents with   bumps   Abdominal Pain    HPI Adam Guzman is a 28 y.o. male.   Here for 2 bumps on his left scalp that he has noticed in the last week.  No drainage  No fever  He does have some lower abdominal pain has been bothering him a month or more.  He has seen his PCP office about this and has been referred to GI.  He now has the number to call gastroenterology and will call them about the referral . Dicyclomine did not help.  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 Authorizing Provider  cephALEXin (KEFLEX) 500 MG capsule Take 1 capsule (500 mg total) by mouth 2 (two) times daily for 7 days. 11/08/23 11/15/23 Yes Savera Donson, Janace Aris, MD    Family History Family History  Problem Relation Age of Onset   Cancer Father        Died at 40   Healthy Mother     Social History Social History   Tobacco Use   Smoking status: Every Day    Current packs/day: 0.50    Types: Cigarettes    Passive exposure: Current   Smokeless tobacco: Never   Vaping Use   Vaping status: Some Days   Substances: Nicotine, Flavoring  Substance Use Topics   Alcohol use: Not Currently    Comment: socially   Drug use: No     Allergies   Patient has no known allergies.   Review of Systems Review of Systems  Gastrointestinal:  Positive for abdominal pain.     Physical Exam Triage Vital Signs ED Triage Vitals  Encounter Vitals Group     BP 11/08/23 1010 129/88     Systolic BP Percentile --      Diastolic BP Percentile --      Pulse Rate 11/08/23 1010 90     Resp 11/08/23 1010 18     Temp 11/08/23 1010 98.1 F (36.7 C)     Temp src --      SpO2 11/08/23 1010 96 %     Weight --      Height --      Head Circumference --      Peak Flow --      Pain Score 11/08/23 1008 6     Pain Loc --      Pain Education --      Exclude from Growth Chart --    No data found.  Updated Vital Signs BP  129/88   Pulse 90   Temp 98.1 F (36.7 C)   Resp 18   SpO2 96%   Visual Acuity Right Eye Distance:   Left Eye Distance:   Bilateral Distance:    Right Eye Near:   Left Eye Near:    Bilateral Near:     Physical Exam Vitals reviewed.  Constitutional:      General: He is not in acute distress.    Appearance: He is not ill-appearing, toxic-appearing or diaphoretic.  Musculoskeletal:     Comments: There is a 5 mm raised bump at the hairline just posterior to his left pinna.  There is no fluctuance or drainage.  There is a similar bump on his parietal area that is unroofed but not draining.  Skin:    Coloration: Skin is not pale.  Neurological:     Mental Status: He is alert and oriented to person, place, and time.  Psychiatric:        Behavior: Behavior normal.      UC Treatments / Results  Labs (all labs ordered are listed, but only abnormal results are displayed) Labs Reviewed - No data to display  EKG   Radiology No results found.  Procedures Procedures (including critical care time)  Medications Ordered in  UC Medications - No data to display  Initial Impression / Assessment and Plan / UC Course  I have reviewed the triage vital signs and the nursing notes.  Pertinent labs & imaging results that were available during my care of the patient were reviewed by me and considered in my medical decision making (see chart for details).     Keflex is sent in to treat possible folliculitis.  He will call gastro today about an appointment Final Clinical Impressions(s) / UC Diagnoses   Final diagnoses:  Folliculitis     Discharge Instructions      Take cephalexin 500 mg--1 capsule 2 times daily for 7 days        ED Prescriptions     Medication Sig Dispense Auth. Provider   cephALEXin (KEFLEX) 500 MG capsule Take 1 capsule (500 mg total) by mouth 2 (two) times daily for 7 days. 14 capsule Marlinda Mike, Janace Aris, MD      PDMP not reviewed this encounter.   Zenia Resides, MD 11/08/23 [phone]

## 2023-11-08 NOTE — Discharge Instructions (Signed)
 Take cephalexin 500 mg--1 capsule 2 times daily for 7 days

## 2023-11-08 NOTE — ED Triage Notes (Signed)
 PT reports he has bumps on Lt side of head. Pt also reports Lower ABD pain for a while and has seen multiple MD for ABD pain.

## 2023-11-12 ENCOUNTER — Ambulatory Visit: Payer: Self-pay | Admitting: Student

## 2023-11-12 ENCOUNTER — Encounter: Payer: Self-pay | Admitting: Gastroenterology

## 2023-11-14 ENCOUNTER — Ambulatory Visit: Payer: Self-pay | Admitting: Student

## 2023-11-29 ENCOUNTER — Ambulatory Visit: Payer: Self-pay | Admitting: Family Medicine

## 2023-11-29 VITALS — BP 128/60 | HR 85 | Ht 71.0 in | Wt 144.6 lb

## 2023-11-29 DIAGNOSIS — L02811 Cutaneous abscess of head [any part, except face]: Secondary | ICD-10-CM

## 2023-11-29 MED ORDER — DOXYCYCLINE HYCLATE 100 MG PO TABS
100.0000 mg | ORAL_TABLET | Freq: Two times a day (BID) | ORAL | 0 refills | Status: AC
Start: 2023-11-29 — End: 2023-12-09

## 2023-11-29 NOTE — Progress Notes (Signed)
    SUBJECTIVE:   CHIEF COMPLAINT / HPI:   Scalp concerns Seen 3/14 in the ED for possible folliculitis of his scalp and treated with Keflex for 7 days Symptoms initially improved with keflex but returned about 1 year ago Small bumps on back of his head as well as front of scalp Some pus drainage Tender   No recent haircuts Shampoos regularly   PERTINENT  PMH / PSH: scalp abscess  OBJECTIVE:   BP 128/60   Pulse 85   Ht 5\' 11"  (1.803 m)   Wt 144 lb 9.6 oz (65.6 kg)   SpO2 98%   BMI 20.17 kg/m   General: NAD, pleasant, able to participate in exam Respiratory: No respiratory distress Psych: Normal affect and mood  Scalp: <1cm lesion noted posterior scalp. Tender to touch with mild fluctuance and palpable underlying fluid collection/sac <1cm lesion also noted on front left scalp but less visible due to hair. This one did not have palpable sac but was also tender Some hyperpigmentation and scabbing but otherwise no significant skin changes or drainage/bleeding  ASSESSMENT/PLAN:   Assessment & Plan Scalp abscess Rx doxycycline BID x 10 days for MRSA coverage F/u PRN discussed return precautions and hygiene If no improvement consider I&D   Vonna Drafts, MD [facility]

## 2023-11-29 NOTE — Assessment & Plan Note (Signed)
 Rx doxycycline BID x 10 days for MRSA coverage F/u PRN discussed return precautions and hygiene If no improvement consider I&D

## 2023-11-29 NOTE — Patient Instructions (Signed)
 Take doxycycline twice daily for 10 days  Please let us know if your symptoms worsen or do not improve with this

## 2023-12-08 ENCOUNTER — Other Ambulatory Visit: Payer: Self-pay | Admitting: Student

## 2023-12-08 MED ORDER — DICYCLOMINE HCL 10 MG PO CAPS: 10.0000 mg | ORAL_CAPSULE | Freq: Three times a day (TID) | ORAL | 0 refills | Status: DC

## 2023-12-09 ENCOUNTER — Encounter: Payer: Self-pay | Admitting: Student

## 2023-12-11 ENCOUNTER — Encounter: Payer: Self-pay | Admitting: Student

## 2023-12-11 ENCOUNTER — Ambulatory Visit: Payer: Self-pay | Admitting: Student

## 2023-12-11 VITALS — BP 118/62 | HR 93 | Ht 71.0 in | Wt 144.6 lb

## 2023-12-11 DIAGNOSIS — L659 Nonscarring hair loss, unspecified: Secondary | ICD-10-CM

## 2023-12-11 DIAGNOSIS — R141 Gas pain: Secondary | ICD-10-CM

## 2023-12-11 DIAGNOSIS — R142 Eructation: Secondary | ICD-10-CM

## 2023-12-11 DIAGNOSIS — R143 Flatulence: Secondary | ICD-10-CM

## 2023-12-11 MED ORDER — SIMETHICONE 80 MG PO TABS
80.0000 mg | ORAL_TABLET | Freq: Three times a day (TID) | ORAL | 1 refills | Status: DC
Start: 2023-12-11 — End: 2024-01-08

## 2023-12-11 NOTE — Progress Notes (Signed)
    SUBJECTIVE:   CHIEF COMPLAINT / HPI:   Rumbling in stomach  gas  patch of hair loss  Patient currently undergoing workup for chronic abdominal pain.  Constipation, with intermittent diarrhea or normal bowel movements.  Abdominal pain is periumbilical and occasionally right lower quadrant.  His primary concern is that his stomach rumbles constantly throughout the day, he has increased flatulence and eructation.  Currently, he has no pain.  No fevers.  He has NF1, which is placing at high risk of constipation and IBS.  Does not drink well water, no past travel.  No bloody stools.  Normal appetite.  Diet is low fiber, drinks a lot of soda and juice.  He recently went to the barber this past week, and noticed a patch of hair loss.  States he wanted to check to make sure this was not pathologic.   OBJECTIVE:   BP 118/62   Pulse 93   Ht 5\' 11"  (1.803 m)   Wt 144 lb 9.6 oz (65.6 kg)   SpO2 96%   BMI 20.17 kg/m    General: NAD, pleasany Cardio: RRR, no MRG. Cap Refill <2s. Respiratory: CTAB, normal wob on RA GI: Abdomen is soft, not tender, not distended. BS present Skin: Warm and dry Scalp: Small area of hair loss on left lateral scalp, follicles present.  No scale, erythema.  ASSESSMENT/PLAN:   Assessment & Plan Flatulence, eructation and gas pain Currently undergoing workup for chronic abdominal pain.  Strongly suspect IBS given constipation followed by intermittent diarrhea.  Increased flatulence, eructation.  Additionally, counseled that excessive soda/juice consumption could also be related to his symptoms. - Continue Bentyl 10 mg 3 times daily, optional at bedtime. - Simethicone 80 mg 3 times daily with meals as needed - Follow-up with GI Patchy loss of hair Follicles present, recently had haircut.  Suspect this is from shaving. - Follow-up if hair fails to grow back, or patch increases   Lavada Porteous, DO [facility]

## 2023-12-11 NOTE — Patient Instructions (Signed)
 It was great to see you! Thank you for allowing me to participate in your care!   I recommend that you always bring your medications to each appointment as this makes it easy to ensure we are on the correct medications and helps us  not miss when refills are needed.  Our plans for today:  - Continue taking Bentyl. I recommend 3 times daily. - Additionally, you can take simethicone 3 times daily after eating to help with gas  Take care and seek immediate care sooner if you develop any concerns. Please remember to show up 15 minutes before your scheduled appointment time!  Lavada Porteous, DO [facility]

## 2023-12-31 ENCOUNTER — Ambulatory Visit: Payer: Self-pay | Admitting: Student

## 2023-12-31 ENCOUNTER — Encounter: Payer: Self-pay | Admitting: Student

## 2023-12-31 VITALS — BP 122/85 | HR 95 | Temp 98.1°F | Ht 71.0 in | Wt 146.0 lb

## 2023-12-31 DIAGNOSIS — L639 Alopecia areata, unspecified: Secondary | ICD-10-CM

## 2023-12-31 MED ORDER — TRIAMCINOLONE ACETONIDE 0.5 % EX OINT: 1.0000 | TOPICAL_OINTMENT | Freq: Two times a day (BID) | CUTANEOUS | 0 refills | Status: DC

## 2023-12-31 NOTE — Patient Instructions (Signed)
 It was great to see you today! Thank you for choosing [facility] for your primary care.  Today we addressed: Try triamcinolone 0.5 % cream twice a day for next 3-4 weeks and call if not improving  Avoid hair pulling   If you haven't already, sign up for My Chart to have easy access to your labs results, and communication with your primary care physician.   Please arrive 15 minutes before your appointment to ensure smooth check in process.  We appreciate your efforts in making this happen.  Thank you for allowing me to participate in your care, Adam Headland, MD 12/31/2023, 4:24 PM PGY-3, [facility]

## 2023-12-31 NOTE — Progress Notes (Signed)
    SUBJECTIVE:   CHIEF COMPLAINT / HPI:   Patches on Head: He experiences hair loss primarily on one side of his head, noticed approximately three to four weeks ago after a haircut. The affected area is a circular patch that has not increased in size but appears to be thinning. Hair has started growing back, though it remains thin under light. He experiences itching in the affected area. No hair falls out when brushing.      PERTINENT  PMH / PSH: NF1  Multiple somatic complaints   OBJECTIVE:   BP 122/85   Pulse 95   Temp 98.1 F (36.7 C) (Oral)   Ht 5\' 11"  (1.803 m)   Wt 146 lb (66.2 kg)   SpO2 100%   BMI 20.36 kg/m   General: Alert and oriented in no apparent distress Heart: Regular rate and rhythm with no murmurs appreciated Lungs: Normal WOB Abdomen: no abdominal pain Skin: Warm and dry Small patches of hair loss, see above, circular with no scaling or drainage, no erythema.    ASSESSMENT/PLAN:   Assessment & Plan Alopecia areata Trial medium potency steroid cream for what appears to be alopecia areata. Call in 4 weeks if seeing no improvement with cream. No systemic symptoms.      Ernestina Headland, MD [facility]

## 2024-01-08 ENCOUNTER — Ambulatory Visit (INDEPENDENT_AMBULATORY_CARE_PROVIDER_SITE_OTHER): Payer: Self-pay | Admitting: Gastroenterology

## 2024-01-08 ENCOUNTER — Encounter: Payer: Self-pay | Admitting: Gastroenterology

## 2024-01-08 ENCOUNTER — Telehealth: Payer: Self-pay | Admitting: Medical Genetics

## 2024-01-08 VITALS — BP 118/86 | HR 100 | Ht 71.0 in | Wt 142.0 lb

## 2024-01-08 DIAGNOSIS — K5904 Chronic idiopathic constipation: Secondary | ICD-10-CM

## 2024-01-08 DIAGNOSIS — R1013 Epigastric pain: Secondary | ICD-10-CM

## 2024-01-08 NOTE — Progress Notes (Signed)
 Chief Complaint:Periumbilical abd pain Primary GI Doctor: Dr. Venice Gillis  HPI:  Patient is a  28 year old male patient with past medical history of Neurofibromatosis, who was referred to me by Azell Boll, MD on 10/23/23 for a complaint of periumbilical ab pain .    Pt seen in ED 11/08/23 for abdominal pain and 2 bumps on scalp.He does have some lower abdominal pain has been bothering him a month or more. Keflex  is sent in to treat possible folliculitis.   Interval History    Patient presents to discuss periumbilical abdominal pain described as burning across the top of his abdomen, accomapnied by his mom who he asks to step out of room.  He states eating food does seem to make it worse. No nausea or vomiting. Appetite good. Weight stable. No herbal supplements. No new medications. OTC ibuprofen  prn, very seldom. Denies GERD or dysphagia. Dicyclomine  did not help.  He has not tried antiacids. Admits he eats poorly.     Patient also has issues with constipation. He states he sits on toilet 30 minutes at a time. Patient states he has tried OTC Miralax without improvement in symptoms. No blood in stool. No rectal pain. The pain in stomach is worse with constipation.   Current smoker, tobacco 1 pack per 2-3 days.  Drinks occasionally.   Patient's family history includes cancer in father, unsure of type.  Wt Readings from Last 3 Encounters:  01/08/24 142 lb (64.4 kg)  12/31/23 146 lb (66.2 kg)  12/11/23 144 lb 9.6 oz (65.6 kg)    Past Medical History:  Diagnosis Date   Neurofibromatosis (HCC)     Past Surgical History:  Procedure Laterality Date   WISDOM TOOTH EXTRACTION      Current Outpatient Medications  Medication Sig Dispense Refill   polyethylene glycol (MIRALAX / GLYCOLAX) 17 g packet Take 17 g by mouth as needed.     triamcinolone  ointment (KENALOG ) 0.5 % Apply 1 Application topically 2 (two) times daily. 3 weeks 60 g 0   No current facility-administered medications for  this visit.    Allergies as of 01/08/2024   (No Known Allergies)    Family History  Problem Relation Age of Onset   Healthy Mother    Cancer Father        Died at 73   Colon cancer Neg Hx    Esophageal cancer Neg Hx    Stomach cancer Neg Hx     Review of Systems:    Constitutional: No weight loss, fever, chills, weakness or fatigue HEENT: Eyes: No change in vision               Ears, Nose, Throat:  No change in hearing or congestion Skin: No rash or itching Cardiovascular: No chest pain, chest pressure or palpitations   Respiratory: No SOB or cough Gastrointestinal: See HPI and otherwise negative Genitourinary: No dysuria or change in urinary frequency Neurological: No headache, dizziness or syncope Musculoskeletal: No new muscle or joint pain Hematologic: No bleeding or bruising Psychiatric: No history of depression or anxiety    Physical Exam:  Vital signs: BP 118/86   Pulse 100   Ht 5\' 11"  (1.803 m)   Wt 142 lb (64.4 kg)   SpO2 98%   BMI 19.80 kg/m   Constitutional:  Pleasant male appears to be in NAD, Well developed, Well nourished, alert and cooperative Head:  Normocephalic and atraumatic. Eyes:   PEERL, EOMI. No icterus. Conjunctiva pink. Ears:  Normal auditory acuity. Neck:  Supple Throat: Oral cavity and pharynx without inflammation, swelling or lesion.  Respiratory: Respirations even and unlabored. Lungs clear to auscultation bilaterally.   No wheezes, crackles, or rhonchi.  Cardiovascular: Normal S1, S2. Regular rate and rhythm. No peripheral edema, cyanosis or pallor.  Gastrointestinal:  Soft, nondistended, nontender. No rebound or guarding. Normal bowel sounds. No appreciable masses or hepatomegaly. Rectal:  Not performed.  Msk:  Symmetrical without gross deformities. Without edema, no deformity or joint abnormality.  Neurologic:  Alert and  oriented x4;  grossly normal neurologically.  Skin:   Dry and intact without significant lesions or  rashes. Psychiatric: Oriented to person, place and time. Demonstrates good judgement and reason without abnormal affect or behaviors.  RELEVANT LABS AND IMAGING: CBC    Latest Ref Rng & Units 10/23/2023   10:10 AM 10/15/2023    9:57 AM 05/10/2017   12:54 PM  CBC  WBC 3.4 - 10.8 x10E3/uL 3.4  4.8  3.0   Hemoglobin 13.0 - 17.7 g/dL 78.4  69.6  29.5   Hematocrit 37.5 - 51.0 % 51.5  54.3  46.5   Platelets 150 - 450 x10E3/uL 264  160  176      CMP     Latest Ref Rng & Units 10/15/2023    9:57 AM 05/10/2017   12:54 PM  CMP  Glucose 70 - 99 mg/dL 73  86   BUN 6 - 20 mg/dL 4  14   Creatinine 2.84 - 1.27 mg/dL 1.32  4.40   Sodium 102 - 144 mmol/L 142  137   Potassium 3.5 - 5.2 mmol/L 5.1  4.1   Chloride 96 - 106 mmol/L 99  102   CO2 20 - 29 mmol/L 22  29   Calcium 8.7 - 10.2 mg/dL 9.9  9.6   Total Protein 6.0 - 8.5 g/dL 7.6    Total Bilirubin 0.0 - 1.2 mg/dL 0.8    Alkaline Phos 44 - 121 IU/L 84    AST 0 - 40 IU/L 22    ALT 0 - 44 IU/L 14    10/15/23 celiac panel negative, lipase 23  Assessment: Encounter Diagnoses  Name Primary?   Dyspepsia Yes   Chronic idiopathic constipation      28 year old male patient who presents with upper abdominal pain worse with eating. Patient paying out of pocket so would like to try conservative treatment first. We discussed Strict GERD diet, no late meals and starting Pepcid 20 mg twice daily. If no improvement we discussed ordering abdominal ultrasound and H. Pylori diatherix stool test. Lipase negative    For the constipation will switch patient from Miralax to MOM po daily to see if he responds better. Recommend high fiber diet and incorporate Citrucel 1 tsp po daily. Celiac negative.  Plan: -Recommend Abdominal ultrasound complete- no insurance will consider at follow-up if no improvement - Start OTC Pepcid 20 mg twice daily  -Recommend High fiber diet -Start Citrucel 1 tsp po daily  -Try Milk of magnesium once per day -follow-up with me in 3  months  Thank you for the courtesy of this consult. Please call me with any questions or concerns.   Ashlynne Shetterly, FNP-C  Gastroenterology 01/08/2024, 4:01 PM  Cc: Azell Boll, MD

## 2024-01-08 NOTE — Patient Instructions (Addendum)
 Start over the counter Pepcid 20 mg twice daily for abdominal burning GERD diet, no late meals 3-4 hours before bedtime High fiber diet Start Citrucel 1 tsp po daily  Try over the counter Milk of magnesium once per day for constipation  _______________________________________________________  If your blood pressure at your visit was 140/90 or greater, please contact your primary care physician to follow up on this.  _______________________________________________________  If you are age 15 or older, your body mass index should be between 23-30. Your Body mass index is 19.8 kg/m. If this is out of the aforementioned range listed, please consider follow up with your Primary Care Provider.  If you are age 66 or younger, your body mass index should be between 19-25. Your Body mass index is 19.8 kg/m. If this is out of the aformentioned range listed, please consider follow up with your Primary Care Provider.   ________________________________________________________  The [facility] providers would like to encourage you to use MYCHART to communicate with providers for non-urgent requests or questions.  Due to long hold times on the telephone, sending your provider a message by [facility] may be a faster and more efficient way to get a response.  Please allow 48 business hours for a response.  Please remember that this is for non-urgent requests.  _______________________________________________________ Thank you for trusting me with your gastrointestinal care. Deanna May, RNP

## 2024-01-18 NOTE — Progress Notes (Signed)
 Agree with assessment/plan.  Edman Circle, MD [facility] [phone]

## 2024-01-19 ENCOUNTER — Encounter (HOSPITAL_COMMUNITY): Payer: Self-pay

## 2024-01-19 ENCOUNTER — Ambulatory Visit (HOSPITAL_COMMUNITY)
Admission: RE | Admit: 2024-01-19 | Discharge: 2024-01-19 | Disposition: A | Discharge status: Home / Self Care | Payer: Self-pay | Source: Ambulatory Visit | Attending: Nurse Practitioner | Admitting: Nurse Practitioner

## 2024-01-19 VITALS — BP 157/93 | HR 115 | Temp 98.1°F | Resp 13

## 2024-01-19 DIAGNOSIS — G8929 Other chronic pain: Secondary | ICD-10-CM | POA: Insufficient documentation

## 2024-01-19 DIAGNOSIS — R109 Unspecified abdominal pain: Secondary | ICD-10-CM | POA: Insufficient documentation

## 2024-01-19 DIAGNOSIS — Z7251 High risk heterosexual behavior: Secondary | ICD-10-CM | POA: Insufficient documentation

## 2024-01-19 LAB — COMPREHENSIVE METABOLIC PANEL WITH GFR
ALT: 39 U/L (ref 0–44)
AST: 62 U/L — ABNORMAL HIGH (ref 15–41)
Albumin: 4.6 g/dL (ref 3.5–5.0)
Alkaline Phosphatase: 74 U/L (ref 38–126)
Anion gap: 18 — ABNORMAL HIGH (ref 5–15)
BUN: 10 mg/dL (ref 6–20)
CO2: 21 mmol/L — ABNORMAL LOW (ref 22–32)
Calcium: 9.7 mg/dL (ref 8.9–10.3)
Chloride: 97 mmol/L — ABNORMAL LOW (ref 98–111)
Creatinine, Ser: 0.91 mg/dL (ref 0.61–1.24)
GFR, Estimated: >60 mL/min (ref >60)
Glucose, Bld: 77 mg/dL (ref 70–99)
Potassium: 4 mmol/L (ref 3.5–5.1)
Sodium: 136 mmol/L (ref 135–145)
Total Bilirubin: 1.9 mg/dL — ABNORMAL HIGH (ref 0.0–1.2)
Total Protein: 7.3 g/dL (ref 6.5–8.1)

## 2024-01-19 LAB — HIV ANTIBODY (ROUTINE TESTING W REFLEX): HIV Screen 4th Generation wRfx: NONREACTIVE

## 2024-01-19 LAB — LIPASE, BLOOD: Lipase: 32 U/L (ref 11–51)

## 2024-01-19 NOTE — Discharge Instructions (Signed)
 We will contact you with any abnormal results from the testing today.  Recommend continue the Pepcid and MOM for constipation and close follow up with GI especially if symptoms are not improving with their recommendations.    If symptoms worsen and you develop vomiting and are unable to keep any food or fluids down, please seek care in ER.

## 2024-01-19 NOTE — ED Triage Notes (Signed)
 Pt reports had intermittent abd pains for several months. Went to GI and thought could be constipation. Denies n/v, urinary or bowel problems. LBM last week on Wed.

## 2024-01-19 NOTE — ED Provider Notes (Signed)
 [facility]    CSN: [id number] Arrival date & time: 01/19/24  1125      History   Chief Complaint Chief Complaint  Patient presents with   Abdominal Pain    Been having this pain for months - Entered by patient    HPI Adam Guzman is a 28 y.o. male.   Patient presents today with 2 month history of RUQ and epigastric abdominal pain.  Currently rates the pain as a 3/10 and severity waxes and wanes.  Has seen PCP and GI for same, follows up with GI in a couple of months.  No changes to the pain since he saw GI but was doing some reading online and became concerned for STI.  No fever, night sweats, chills, body aches, unexplained weight loss, nausea/vomiting.  Appetite is decreased at times due to pain.  No blood in the stool, heartburn, rash, urinary symptoms, hematuria, or known STI exposures.  He does not know anything that seems to make the pain better or worse.  He has been taking Pepcid twice daily as recommended by GI but not taking MOM daily because he does not want to have an accident at work.    Patient denies penile discharge, penile or genital rashes/sores/lesions, groin swelling, testicular or scrotal pain.  No known exposures to STI.    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 Authorizing Provider  polyethylene glycol (MIRALAX / GLYCOLAX) 17 g packet Take 17 g by mouth as needed.    [provider]  triamcinolone  ointment (KENALOG ) 0.5 % Apply 1 Application topically 2 (two) times daily. 3 weeks 12/31/23   Ernestina Headland, MD    Family History Family History  Problem Relation Age of Onset   Healthy Mother    Cancer Father        Died at 85   Colon cancer Neg Hx    Esophageal cancer Neg Hx    Stomach cancer Neg Hx     Social History Social History   Tobacco Use   Smoking status: Every Day    Current packs/day: 0.50    Types: Cigarettes    Passive exposure: Current   Smokeless tobacco: Never  Vaping Use   Vaping status: Every Day   Substances: Nicotine, Flavoring  Substance Use Topics   Alcohol use: Yes    Comment: socially   Drug use: No     Allergies   Patient has no known allergies.   Review of Systems Review of Systems Per HPI  Physical Exam Triage Vital Signs ED Triage Vitals  Encounter Vitals Group     BP 01/19/24 1144 (!) 157/93     Systolic  BP Percentile --      Diastolic BP Percentile --      Pulse Rate 01/19/24 1144 (!) 115     Resp 01/19/24 1144 13     Temp 01/19/24 1144 98.1 F (36.7 C)     Temp Source 01/19/24 1144 Oral     SpO2 01/19/24 1144 98 %     Weight --      Height --      Head Circumference --      Peak Flow --      Pain Score 01/19/24 1142 0     Pain Loc --      Pain Education --      Exclude from Growth Chart --    No data found.  Updated Vital Signs BP (!) 157/93 (BP Location: Left Arm)   Pulse (!) 115   Temp 98.1 F (36.7 C) (Oral)   Resp 13   SpO2 98%   Visual Acuity Right Eye Distance:   Left Eye Distance:   Bilateral Distance:    Right Eye Near:   Left Eye Near:    Bilateral Near:     Physical Exam Vitals and nursing note reviewed.  Constitutional:      General:  He is not in acute distress.    Appearance: Normal appearance. He is not toxic-appearing.  HENT:     Head: Normocephalic and atraumatic.     Mouth/Throat:     Mouth: Mucous membranes are moist.     Pharynx: Oropharynx is clear. No posterior oropharyngeal erythema.  Cardiovascular:     Rate and Rhythm: Regular rhythm. Tachycardia present.  Pulmonary:     Effort: Pulmonary effort is normal. No respiratory distress.     Breath sounds: Normal breath sounds. No wheezing, rhonchi or rales.  Abdominal:     General: Abdomen is flat. Bowel sounds are normal. There is no distension.     Palpations: Abdomen is soft.     Tenderness: There is generalized abdominal tenderness. There is no right CVA tenderness, left CVA tenderness, guarding or rebound. Negative signs include Murphy's sign, Rovsing's sign and McBurney's sign.  Genitourinary:    Comments: Deferred - self swab performed by patient Musculoskeletal:     Cervical back: Normal range of motion.  Lymphadenopathy:     Cervical: No cervical adenopathy.  Skin:    General: Skin is warm and dry.     Capillary Refill: Capillary refill takes less than 2 seconds.     Coloration: Skin is not jaundiced or pale.     Findings: No erythema.  Neurological:     Mental Status: He is alert and oriented to person, place, and time.     Motor: No weakness.     Gait: Gait normal.  Psychiatric:        Mood and Affect: Mood is anxious.        Behavior: Behavior is cooperative.      UC Treatments / Results  Labs (all labs ordered are listed, but only abnormal results are displayed) Labs Reviewed  COMPREHENSIVE METABOLIC PANEL WITH GFR  LIPASE, BLOOD  RPR  HIV ANTIBODY (ROUTINE TESTING W REFLEX)  CYTOLOGY, (ORAL, ANAL, URETHRAL) ANCILLARY ONLY    EKG   Radiology No results found.  Procedures Procedures (including critical care time)  Medications Ordered in UC Medications - No data to display  Initial Impression / Assessment and Plan / UC  Course  I have reviewed the triage vital signs and the nursing notes.  Pertinent labs & imaging results that were available during my care of the patient were reviewed by me and considered in my medical decision making (see chart for details).   In triage, patient is mildly hypertensive and tachycardic today.  Otherwise, vital signs are stable.  Patient is mildly anxious appearing.    1. Chronic abdominal pain No red flags on exam Vitals are stable CMP, lipase pending for metabolic cause Recommended follow up with GI as planned Strict ER precautions discussed with patient  2. Unprotected sexual intercourse Cytology is pending - treat as indicated HIV/RPR also pending Safe sex practices discussed with patient   The patient was given the opportunity to ask questions.  All questions answered to their satisfaction.  The patient is in agreement to this plan.   Final Clinical Impressions(s) / UC Diagnoses   Final diagnoses:  Chronic abdominal pain  Unprotected sexual intercourse   Discharge Instructions      We will contact you with any abnormal results from the testing today.  Recommend continue the Pepcid and MOM for constipation and close follow up with GI especially if symptoms are not improving with their recommendations.    If symptoms worsen and you develop vomiting and are unable to keep any food or fluids down, please seek care in ER.  ED Prescriptions   None    PDMP not reviewed this encounter.   Wilhemena Harbour, NP 01/19/24 1318

## 2024-01-20 ENCOUNTER — Ambulatory Visit (HOSPITAL_COMMUNITY): Payer: Self-pay

## 2024-01-20 LAB — RPR: RPR Ser Ql: NONREACTIVE

## 2024-01-21 LAB — CYTOLOGY, (ORAL, ANAL, URETHRAL) ANCILLARY ONLY
Chlamydia: NEGATIVE
Comment: NEGATIVE
Comment: NEGATIVE
Comment: NORMAL
Neisseria Gonorrhea: NEGATIVE
Trichomonas: NEGATIVE

## 2024-01-28 ENCOUNTER — Ambulatory Visit (INDEPENDENT_AMBULATORY_CARE_PROVIDER_SITE_OTHER): Payer: Self-pay | Admitting: Family Medicine

## 2024-01-28 ENCOUNTER — Ambulatory Visit: Payer: Self-pay | Admitting: Student

## 2024-01-28 ENCOUNTER — Other Ambulatory Visit: Payer: Self-pay | Admitting: Family Medicine

## 2024-01-28 VITALS — BP 118/78 | HR 93 | Ht 71.0 in | Wt 142.8 lb

## 2024-01-28 DIAGNOSIS — R141 Gas pain: Secondary | ICD-10-CM

## 2024-01-28 DIAGNOSIS — L739 Follicular disorder, unspecified: Secondary | ICD-10-CM

## 2024-01-28 DIAGNOSIS — R142 Eructation: Secondary | ICD-10-CM

## 2024-01-28 DIAGNOSIS — R143 Flatulence: Secondary | ICD-10-CM

## 2024-01-28 MED ORDER — MUPIROCIN 2 % EX OINT
1.0000 | TOPICAL_OINTMENT | Freq: Two times a day (BID) | CUTANEOUS | 0 refills | Status: AC
Start: 2024-01-28 — End: 2024-02-04

## 2024-01-28 MED ORDER — FAMOTIDINE 20 MG PO TABS: 20.0000 mg | ORAL_TABLET | Freq: Two times a day (BID) | ORAL | 0 refills | Status: DC

## 2024-01-28 NOTE — Patient Instructions (Addendum)
 It was great to see you! Thank you for allowing me to participate in your care!  Our plans for today:  -Start taking a fiber supplement:  - Please start taking Pepcid daily to help with you abdominal pain. -Please continue to take MiraLAX and increase until your bowel movements are soft and at least once daily - I have sent the antibiotic cream Bactroban  to your pharmacy, you will place this twice daily on the infected hair follicle for 7 days - If it does not get better or you continue to have pain or it is worsening please return to care to have it evaluated.   Please arrive 15 minutes PRIOR to your next scheduled appointment time! If you do not, this affects OTHER patients' care.  Take care and seek immediate care sooner if you develop any concerns.   Ivin Marrow, MD, PGY-2 [facility] 3:41 PM 01/28/2024  [facility]

## 2024-01-28 NOTE — Progress Notes (Signed)
    SUBJECTIVE:   CHIEF COMPLAINT / HPI: Abdominal pain  Seen in urgent care yesterday and multiple times in clinic for chronic abdominal pain, suspected to be IBS.  Has appointment with gastroenterology on 08/01.  Pain is more on the side of stomach. Hearing more growling. Has not taken any medicine. Miralax has made stools slightly softer. Still going once every several days. Has not tried Pepcid as instructed by GI doctors. Has not started fiber supplement. Still waiting on insurance through job.  Has bump that is bothering him on his head. Been there for one week. No fevers. History of folliculitis   PERTINENT  PMH / PSH: Migraines, neurofibromatosis type I, chronic abdominal pain  OBJECTIVE:   BP 118/78   Pulse 93   Ht 5\' 11"  (1.803 m)   Wt 142 lb 12.8 oz (64.8 kg)   SpO2 99%   BMI 19.92 kg/m   General: NAD  Neuro: A&O Head: mild erythema surround hair follicle on posterior head, no induration or fluctuance Cardiovascular: RRR, no murmurs, no peripheral edema Respiratory: normal WOB on RA, CTAB, no wheezes, ronchi or rales Abdomen: soft, NTTP, no rebound or guarding Extremities: Moving all 4 extremities equally   ASSESSMENT/PLAN:   Assessment & Plan Flatulence, eructation and gas pain Chronic abdominal pain dyspepsia versus IBS versus sequela of NF1. Patient is not being compliant with plan outlined by GI provider from mid May.  Reemphasized starting Pepcid and adding fiber supplement.  Patient agreeable to plan.  Discussed continuing to titrate his MiraLAX to 1 stool per day that is soft.  Recommended to continue to follow-up with GI provider. Hair follicle infection No palpable abscess.  Will treat with Bactroban  2% cream twice daily for 7 days.  Discussed return precautions for fevers or worsening pain and erythema.  Follow-up as needed  Return if symptoms worsen or fail to improve.  Ivin Marrow, MD [facility]

## 2024-01-28 NOTE — Progress Notes (Deleted)
    SUBJECTIVE:   CHIEF COMPLAINT / HPI:   ***  PERTINENT  PMH / PSH: ***  OBJECTIVE:   There were no vitals taken for this visit.  ***  ASSESSMENT/PLAN:   Assessment & Plan      Alfredo Martinez, MD [facility]

## 2024-02-04 ENCOUNTER — Ambulatory Visit: Payer: Self-pay | Admitting: Student

## 2024-02-04 ENCOUNTER — Encounter: Payer: Self-pay | Admitting: *Deleted

## 2024-03-11 ENCOUNTER — Telehealth: Payer: Self-pay | Admitting: Gastroenterology

## 2024-03-11 ENCOUNTER — Encounter: Payer: Self-pay | Admitting: Gastroenterology

## 2024-03-11 ENCOUNTER — Ambulatory Visit (INDEPENDENT_AMBULATORY_CARE_PROVIDER_SITE_OTHER): Payer: Self-pay | Admitting: Gastroenterology

## 2024-03-11 ENCOUNTER — Other Ambulatory Visit: Payer: Self-pay

## 2024-03-11 VITALS — BP 124/76 | HR 103 | Ht 71.0 in | Wt 142.2 lb

## 2024-03-11 DIAGNOSIS — K5904 Chronic idiopathic constipation: Secondary | ICD-10-CM

## 2024-03-11 DIAGNOSIS — R1013 Epigastric pain: Secondary | ICD-10-CM

## 2024-03-11 NOTE — Progress Notes (Signed)
 Chief Complaint: follow-up Periumbilical abd pain Primary GI Doctor: Dr. Charlanne  HPI:  Patient is a  28 year old male patient with past medical history of Neurofibromatosis, who was referred to me by Bryan Bianchi, MD on 10/23/23 for a complaint of periumbilical ab pain .    Pt seen in ED 11/08/23 for abdominal pain and 2 bumps on scalp.He does have some lower abdominal pain has been bothering him a month or more. Keflex  is sent in to treat possible folliculitis.   Pt seen in urgent care 01/19/24 for abd pain. Patient had concerns with STI, testing done. Labs show: total bili 1.9, AST 62, RPR neg, HIV non reactive, lipase 32, STI panel negative  Interval History     Patient presents to discuss periumbilical abdominal pain described as burning across the top of his abdomen.  He states eating does seem to make it worse. He has been trying to eat better since our last visit.  No nausea or vomiting. Appetite good. Weight stable. OTC ibuprofen  prn, very seldom. Denies GERD or dysphagia. Dicyclomine  did not help. Started patient on Pepcid  twice daily at last appointment without improvement.      Patient also has issues with constipation. He states he sits on toilet 30 minutes at a time. Patient states he has tried OTC Miralax without improvement in symptoms. I recommended he use MOM which helps sometimes. He admits he is not taking it everyday as I instructed he do for best results due to his job and difficulty having access to restroom. No blood in stool. No rectal pain.   Wt Readings from Last 3 Encounters:  03/11/24 142 lb 3.2 oz (64.5 kg)  01/28/24 142 lb 12.8 oz (64.8 kg)  01/08/24 142 lb (64.4 kg)    Past Medical History:  Diagnosis Date   Neurofibromatosis (HCC)     Past Surgical History:  Procedure Laterality Date   WISDOM TOOTH EXTRACTION      Current Outpatient Medications  Medication Sig Dispense Refill   famotidine  (PEPCID ) 20 MG tablet Take 1 tablet (20 mg total) by mouth 2  (two) times daily. 60 tablet 0   polyethylene glycol (MIRALAX / GLYCOLAX) 17 g packet Take 17 g by mouth as needed.     triamcinolone  ointment (KENALOG ) 0.5 % Apply 1 Application topically 2 (two) times daily. 3 weeks 60 g 0   No current facility-administered medications for this visit.    Allergies as of 03/11/2024   (No Known Allergies)    Family History  Problem Relation Age of Onset   Healthy Mother    Cancer Father        Died at 33   Colon cancer Neg Hx    Esophageal cancer Neg Hx    Stomach cancer Neg Hx     Review of Systems:    Constitutional: No weight loss, fever, chills, weakness or fatigue HEENT: Eyes: No change in vision               Ears, Nose, Throat:  No change in hearing or congestion Skin: No rash or itching Cardiovascular: No chest pain, chest pressure or palpitations   Respiratory: No SOB or cough Gastrointestinal: See HPI and otherwise negative Genitourinary: No dysuria or change in urinary frequency Neurological: No headache, dizziness or syncope Musculoskeletal: No new muscle or joint pain Hematologic: No bleeding or bruising Psychiatric: No history of depression or anxiety    Physical Exam:  Vital signs: BP 124/76   Pulse ROLLEN)  103   Ht 5' 11 (1.803 m)   Wt 142 lb 3.2 oz (64.5 kg)   SpO2 98%   BMI 19.83 kg/m   Constitutional:  Pleasant male appears to be in NAD, Well developed, Well nourished, alert and cooperative Throat: Oral cavity and pharynx without inflammation, swelling or lesion.  Respiratory: Respirations even and unlabored. Lungs clear to auscultation bilaterally.   No wheezes, crackles, or rhonchi.  Cardiovascular: Normal S1, S2. Regular rate and rhythm. No peripheral edema, cyanosis or pallor.  Gastrointestinal:  Soft, nondistended, nontender. No rebound or guarding. Normal bowel sounds. No appreciable masses or hepatomegaly. Rectal:  Not performed.  Msk:  Symmetrical without gross deformities. Without edema, no deformity or  joint abnormality.  Neurologic:  Alert and  oriented x4;  grossly normal neurologically.  Skin:   Dry and intact without significant lesions or rashes. Psychiatric: Oriented to person, place and time. Demonstrates good judgement and reason without abnormal affect or behaviors.  RELEVANT LABS AND IMAGING: CBC    Latest Ref Rng & Units 10/23/2023   10:10 AM 10/15/2023    9:57 AM 05/10/2017   12:54 PM  CBC  WBC 3.4 - 10.8 x10E3/uL 3.4  4.8  3.0   Hemoglobin 13.0 - 17.7 g/dL 83.0  81.5  83.8   Hematocrit 37.5 - 51.0 % 51.5  54.3  46.5   Platelets 150 - 450 x10E3/uL 264  160  176      CMP     Latest Ref Rng & Units 01/19/2024   12:08 PM 10/15/2023    9:57 AM 05/10/2017   12:54 PM  CMP  Glucose 70 - 99 mg/dL 77  73  86   BUN 6 - 20 mg/dL 10  4  14    Creatinine 0.61 - 1.24 mg/dL 9.08  9.06  9.02   Sodium 135 - 145 mmol/L 136  142  137   Potassium 3.5 - 5.1 mmol/L 4.0  5.1  4.1   Chloride 98 - 111 mmol/L 97  99  102   CO2 22 - 32 mmol/L 21  22  29    Calcium 8.9 - 10.3 mg/dL 9.7  9.9  9.6   Total Protein 6.5 - 8.1 g/dL 7.3  7.6    Total Bilirubin 0.0 - 1.2 mg/dL 1.9  0.8    Alkaline Phos 38 - 126 U/L 74  84    AST 15 - 41 U/L 62  22    ALT 0 - 44 U/L 39  14    10/15/23 celiac panel negative, lipase 23  Assessment: Encounter Diagnoses  Name Primary?   Chronic idiopathic constipation Yes   Dyspepsia        28 year old male patient who presents with persistent dyspepsia and stomach gurgling. Not relieved with antispasmodics or h2 antagonists. Will go ahead and proceed with abdominal ultrasound and h pylori diathereix stool test. He ha snot had any abd imaging done to evaluate. 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    Patient also has issues with chronic constipation and he is concerned with taking laxatives with his current job We discussed he could try taking it at bedtime to see if this would prevent disturbance with work by having bowel movement the next day.   Recommended cone  assistance due to no insurance, he will pick up paperwork on way out.  Plan: -Order Abdominal ultrasound complete  -order h pylori diathereix stool  -Continue High fiber diet -Continue Citrucel 1 tsp po daily  -  Continue MOM once per day scheduled not prn -follow-up with me in 3-4 months  Thank you for the courtesy of this consult. Please call me with any questions or concerns.   Klein Willcox, FNP-C [facility] 03/11/2024, 9:49 AM  Cc: Bryan Bianchi, MD

## 2024-03-11 NOTE — Patient Instructions (Addendum)
 Recommend high fiber diet Recommend GERD diet Try taking Milk of Magnesium at night and drink something warm in morning to see if it regulates your bowel movements.  You have been scheduled for an abdominal ultrasound at [facility] (1st floor of hospital) on 03/15/24 at 9:00am. Please arrive 30 minutes prior to your appointment for registration. Make certain not to have anything to eat or drink after midnight prior to your appointment. Should you need to reschedule your appointment, please contact radiology at [phone]. This test typically takes about 30 minutes to perform.  Your provider has ordered Diatherix stool testing for you. You have received a kit from our office today containing all necessary supplies to complete this test. Please carefully read the stool collection instructions provided in the kit before opening the accompanying materials. In addition, be sure there is a label providing your full name and date of birth on the puritan opti-swab tube that is supplied in the kit (if you do not see a label with this information on your test tube, please make us  aware before test collection!). After completing the test, you should secure the purtian tube into the specimen biohazard bag. The [facility] E-Req sheet (including date and time of specimen collection) should be placed into the outside pocket of the specimen biohazard bag and returned to the [place] lab (basement floor of [facility]) within 3 days of collection. Please make sure to give the specimen to a staff member at the lab. DO NOT leave the specimen on the counter.   If the specimen date and time (can be found in the upper right boxed portion of the sheet) are not filled out on the E-Req sheet, the test will NOT be performed.    _______________________________________________________  If your blood pressure at your visit was 140/90 or greater, please contact your primary care  physician to follow up on this.  _______________________________________________________  If you are age 33 or older, your body mass index should be between 23-30. Your Body mass index is 19.83 kg/m. If this is out of the aforementioned range listed, please consider follow up with your Primary Care Provider.  If you are age 78 or younger, your body mass index should be between 19-25. Your Body mass index is 19.83 kg/m. If this is out of the aformentioned range listed, please consider follow up with your Primary Care Provider.   ________________________________________________________  The [facility] providers would like to encourage you to use MYCHART to communicate with providers for non-urgent requests or questions.  Due to long hold times on the telephone, sending your provider a message by [facility] may be a faster and more efficient way to get a response.  Please allow 48 business hours for a response.  Please remember that this is for non-urgent requests.  _______________________________________________________  Thank you for trusting me with your gastrointestinal care. Deanna May, RNP

## 2024-03-11 NOTE — Telephone Encounter (Signed)
 Left message for patient to call office.

## 2024-03-11 NOTE — Telephone Encounter (Signed)
 Patient wanted clarification on how to get the stool sample. Questions answered and patient voiced understanding.

## 2024-03-11 NOTE — Telephone Encounter (Signed)
 PT would like to speak with nurse concerning information on the Diatherix stool testing. Please advise.

## 2024-03-15 ENCOUNTER — Ambulatory Visit (HOSPITAL_COMMUNITY)
Admission: RE | Admit: 2024-03-15 | Discharge: 2024-03-15 | Disposition: A | Discharge status: Home / Self Care | Payer: Self-pay | Source: Ambulatory Visit | Attending: Gastroenterology | Admitting: Gastroenterology

## 2024-03-15 DIAGNOSIS — R1013 Epigastric pain: Secondary | ICD-10-CM | POA: Insufficient documentation

## 2024-03-15 DIAGNOSIS — K5904 Chronic idiopathic constipation: Secondary | ICD-10-CM | POA: Insufficient documentation

## 2024-03-21 ENCOUNTER — Ambulatory Visit: Payer: Self-pay | Admitting: Gastroenterology

## 2024-03-23 ENCOUNTER — Encounter (HOSPITAL_COMMUNITY): Payer: Self-pay

## 2024-03-23 ENCOUNTER — Ambulatory Visit (HOSPITAL_COMMUNITY)
Admission: RE | Admit: 2024-03-23 | Discharge: 2024-03-23 | Disposition: A | Discharge status: Home / Self Care | Payer: Self-pay | Source: Ambulatory Visit

## 2024-03-23 VITALS — BP 134/81 | HR 99 | Temp 98.0°F | Resp 16

## 2024-03-23 DIAGNOSIS — L03115 Cellulitis of right lower limb: Secondary | ICD-10-CM

## 2024-03-23 MED ORDER — DOXYCYCLINE HYCLATE 100 MG PO CAPS: 100.0000 mg | ORAL_CAPSULE | Freq: Two times a day (BID) | ORAL | 0 refills | Status: AC

## 2024-03-23 NOTE — Discharge Instructions (Addendum)
  1. Cellulitis of right lower extremity (Primary) - doxycycline  (VIBRAMYCIN ) 100 MG capsule; Take 1 capsule (100 mg total) by mouth 2 (two) times daily for 7 days.  Dispense: 14 capsule; Refill: 0 -Based on the current severity of localized abscess no current incision and drainage is recommended.  Treatment can be managed with oral antibiotic therapy.  If symptoms become worse follow-up in urgent care for I&D. - Continue to monitor site for any change in severity if there is any escalation of current symptoms or development of new symptoms return to urgent care for evaluation management.

## 2024-03-23 NOTE — ED Triage Notes (Addendum)
 Pt presents with a possible abscess on the right leg beside the knee. States he popped the bump yesterday and the pain and swelling has increased since then. Has tried Tylenol  with some relief. Also adds he has intermittent numbness in the right leg.

## 2024-03-23 NOTE — ED Provider Notes (Signed)
 [facility]  Note:  This document was prepared using Dragon voice recognition software and may include unintentional dictation errors.  MRN: [id number] DOB: [birth year]  Subjective:   Adam Guzman is a 28 y.o. male presenting for painful bump to medial right leg x 1 week.  Patient reports that he popped the whitehead yesterday causing some drainage but did not improve pain or swelling.  Patient is been taking Tylenol  with minimal relief to symptoms.  Patient has mild intermittent numbness to the right leg surrounding the bump.  Denies any past history of abscesses.  No fever, erythema, severe swelling, warmth to the area.  No current facility-administered medications for this encounter.  Current Outpatient Medications:    doxycycline  (VIBRAMYCIN ) 100 MG capsule, Take 1 capsule (100 mg total) by mouth 2 (two) times daily for 7 days., Disp: 14 capsule, Rfl: 0   famotidine  (PEPCID ) 20 MG tablet, Take 1 tablet (20 mg total) by mouth 2 (two) times daily., Disp: 60 tablet, Rfl: 0   polyethylene glycol (MIRALAX / GLYCOLAX) 17 g packet, Take 17 g by mouth as needed., Disp: , Rfl:    triamcinolone  ointment (KENALOG ) 0.5 %, Apply 1 Application topically 2 (two) times daily. 3 weeks, Disp: 60 g, Rfl: 0   No Known Allergies  Past Medical History:  Diagnosis Date   Neurofibromatosis [facility])      Past Surgical History:  Procedure Laterality Date   WISDOM TOOTH EXTRACTION      Family History  Problem Relation Age of Onset   Healthy Mother    Cancer Father        Died at 40   Colon cancer Neg Hx    Esophageal cancer Neg Hx    Stomach cancer Neg Hx     Social History   Tobacco Use   Smoking status: Every Day    Current packs/day: 0.50    Types: Cigarettes    Passive exposure: Current   Smokeless tobacco: Never  Vaping Use   Vaping status: Every Day   Substances: Nicotine, Flavoring  Substance Use Topics   Alcohol use: Yes    Comment: socially   Drug use: No     ROS Refer to HPI for ROS details.  Objective:   Vitals: BP 134/81 (BP Location: Left Arm)   Pulse 99   Temp 98 F (36.7 C) (Oral)   Resp 16   SpO2 95%   Physical Exam Vitals and nursing note reviewed.  Constitutional:      General: He is not in acute distress.    Appearance: Normal appearance. He is well-developed. He is not ill-appearing or toxic-appearing.  HENT:     Head: Normocephalic.  Cardiovascular:     Rate and Rhythm: Normal rate.  Pulmonary:     Effort: Pulmonary effort is normal. No respiratory distress.  Skin:    General: Skin is warm and dry.     Capillary Refill: Capillary refill takes less than 2 seconds.     Findings: Erythema, rash and wound present. No abscess. Rash is pustular.  Neurological:     General: No focal deficit present.     Mental Status: He is alert and oriented to person, place, and time.  Psychiatric:        Mood and Affect: Mood normal.        Behavior: Behavior normal.     Procedures  No results found for this or any previous visit (from the past 24 hours).  No results found.  Assessment and Plan :     Discharge Instructions       1. Cellulitis of right lower extremity (Primary) - doxycycline  (VIBRAMYCIN ) 100 MG capsule; Take 1 capsule (100 mg total) by mouth 2 (two) times daily for 7 days.  Dispense: 14 capsule; Refill: 0 -Based on the current severity of localized abscess no current incision and drainage is recommended.  Treatment can be managed with oral antibiotic therapy.  If symptoms become worse follow-up in urgent care for I&D. - Continue to monitor site for any change in severity if there is any escalation of current symptoms or development of new symptoms return to urgent care for evaluation management.      Kasiyah Platter B Twana Wileman   Lillianne Eick, [place], [place] 03/23/24 1420

## 2024-03-25 ENCOUNTER — Ambulatory Visit (HOSPITAL_COMMUNITY)
Admission: EM | Admit: 2024-03-25 | Discharge: 2024-03-25 | Disposition: A | Discharge status: Home / Self Care | Payer: Self-pay | Attending: Family Medicine | Admitting: Family Medicine

## 2024-03-25 ENCOUNTER — Encounter (HOSPITAL_COMMUNITY): Payer: Self-pay

## 2024-03-25 DIAGNOSIS — L02415 Cutaneous abscess of right lower limb: Secondary | ICD-10-CM

## 2024-03-25 MED ORDER — LIDOCAINE HCL (PF) 2 % IJ SOLN
INTRAMUSCULAR | Status: AC
  Filled 2024-03-25: qty 5

## 2024-03-25 NOTE — ED Triage Notes (Signed)
 Patient presents for wound check on his right leg beside the knee. Patient reports increase pain and swelling. Patient is taking Doxycycline  as prescribed.

## 2024-03-26 NOTE — ED Provider Notes (Signed)
 [facility]   [id number] 03/25/24 Arrival Time: 1856  ASSESSMENT & PLAN:  1. Abscess of leg, right     Incision and Drainage Procedure Note  Anesthesia: 2% plain lidocaine   Procedure Details  The procedure, risks and complications have been discussed in detail (including, but not limited to pain and bleeding) with the patient.  The skin induration was prepped and draped in the usual fashion. After adequate local anesthesia, I&D with a #11 blade was performed on the right medial knee with bloody, purulent drainage.  EBL: minimal Drains: none Packing: n/a Condition: Tolerated procedure well Complications: none.  No orders of the defined types were placed in this encounter.   Wound care instructions discussed and given in written format. To return in 48 hours for wound check.  Finish all antibiotics. OTC analgesics as needed.  Reviewed expectations re: course of current medical issues. Questions answered. Outlined signs and symptoms indicating need for more acute intervention. Patient verbalized understanding. After Visit Summary given.   SUBJECTIVE:  Adam Guzman is a 28 y.o. male who presents with a possible infection of his R inner leg at knee. Previous visit reviewed. Area is getting bigger.   OBJECTIVE:  Vitals:   03/25/24 1924  BP: 124/86  Pulse: 93  Resp: 18  Temp: 98.6 F (37 C)  TempSrc: Oral  SpO2: 98%     General appearance: alert; no distress RLE: approx 1.5 cm induration of his inner R leg at medial knee; tender to touch; no active drainage or bleeding Psychological: alert and cooperative; normal mood and affect  No Known Allergies  Past Medical History:  Diagnosis Date   Neurofibromatosis (HCC)    Social History   Socioeconomic History   Marital status: Single    Spouse name: Not on file   Number of children: Not on file   Years of education: Not on file   Highest education level: Not on file  Occupational History    Not on file  Tobacco Use   Smoking status: Every Day    Current packs/day: 0.50    Types: Cigarettes    Passive exposure: Current   Smokeless tobacco: Never  Vaping Use   Vaping status: Every Day   Substances: Nicotine, Flavoring  Substance and Sexual Activity   Alcohol use: Yes    Comment: socially   Drug use: No   Sexual activity: Yes  Other Topics Concern   Not on file  Social History Narrative   [name] is a high school graduate.   He graduated from [employer].    He lives with his mother, brother, and step-dad.    He enjoys playing video games, riding his bike, watching TV, and parkour.   He is a Airline pilot at [employer] Strain: Not on file  Food Insecurity: No Food Insecurity (10/17/2023)   Hunger Vital Sign    Worried About Running Out of Food in the Last Year: Never true    Ran Out of Food in the Last Year: Never true  Transportation Needs: No Transportation Needs (10/17/2023)   PRAPARE - Administrator, Civil Service (Medical): No    Lack of Transportation (Non-Medical): No  Physical Activity: Not on file  Stress: Not on file  Social Connections: Not on file   Family History  Problem Relation Age of Onset   Healthy Mother    Cancer Father  Died at 56   Colon cancer Neg Hx    Esophageal cancer Neg Hx    Stomach cancer Neg Hx    Past Surgical History:  Procedure Laterality Date   WISDOM TOOTH EXTRACTION              Rolinda Rogue, MD 03/26/24 0900

## 2024-03-27 ENCOUNTER — Ambulatory Visit: Payer: Self-pay | Admitting: Gastroenterology

## 2024-04-05 ENCOUNTER — Emergency Department (HOSPITAL_COMMUNITY)
Admission: EM | Admit: 2024-04-05 | Discharge: 2024-04-05 | Disposition: A | Discharge status: Home / Self Care | Payer: Self-pay | Attending: Emergency Medicine | Admitting: Emergency Medicine

## 2024-04-05 ENCOUNTER — Other Ambulatory Visit: Payer: Self-pay

## 2024-04-05 DIAGNOSIS — N489 Disorder of penis, unspecified: Secondary | ICD-10-CM

## 2024-04-05 DIAGNOSIS — F17200 Nicotine dependence, unspecified, uncomplicated: Secondary | ICD-10-CM | POA: Insufficient documentation

## 2024-04-05 DIAGNOSIS — N4889 Other specified disorders of penis: Secondary | ICD-10-CM | POA: Insufficient documentation

## 2024-04-05 DIAGNOSIS — Z202 Contact with and (suspected) exposure to infections with a predominantly sexual mode of transmission: Secondary | ICD-10-CM

## 2024-04-05 LAB — HIV ANTIBODY (ROUTINE TESTING W REFLEX): HIV Screen 4th Generation wRfx: NONREACTIVE

## 2024-04-05 NOTE — ED Provider Notes (Signed)
  [place] EMERGENCY DEPARTMENT AT [facility] Provider Note   CSN: [id number] Arrival date & time: 04/05/24  8094     Patient presents with: Exposure to STD   Adam Guzman is a 28 y.o. male.  Bump - nonpainful since Friday. Same partner. Non condoms. No penile discharge.  monogomous  {Add pertinent medical, surgical, social history, OB history to HPI:32947}  Exposure to STD       Prior to Admission medications   Medication Sig Start Date End Date Taking? Authorizing Provider  famotidine  (PEPCID ) 20 MG tablet Take 1 tablet (20 mg total) by mouth 2 (two) times daily. 01/28/24   Quillen, Michael, MD  polyethylene glycol (MIRALAX / GLYCOLAX) 17 g packet Take 17 g by mouth as needed.    [provider]  triamcinolone  ointment (KENALOG ) 0.5 % Apply 1 Application topically 2 (two) times daily. 3 weeks 12/31/23   Bryan Bianchi, MD    Allergies: Patient has no known allergies.    Review of Systems  Updated Vital Signs BP (!) 147/96 (BP Location: Right Arm)   Pulse 95   Temp 98.8 F (37.1 C) (Oral)   Resp 18   SpO2 99%   Physical Exam  (all labs ordered are listed, but only abnormal results are displayed) Labs Reviewed  RPR  HIV ANTIBODY (ROUTINE TESTING W REFLEX)  GC/CHLAMYDIA PROBE AMP ([place]) NOT AT [facility]    EKG: None  Radiology: No results found.  {Document cardiac monitor, telemetry assessment procedure when appropriate:32947} Procedures   Medications Ordered in the ED - No data to display    {Click here for ABCD2, HEART and other calculators REFRESH Note before signing:1}                              Medical Decision Making Amount and/or Complexity of Data Reviewed Labs: ordered.   ***  {Document critical care time when appropriate  Document review of labs and clinical decision tools ie CHADS2VASC2, etc  Document your independent review of radiology images and any outside records  Document your discussion with family  members, caretakers and with consultants  Document social determinants of health affecting pt's care  Document your decision making why or why not admission, treatments were needed:32947:::1}   Final diagnoses:  None    ED Discharge Orders     None

## 2024-04-05 NOTE — ED Triage Notes (Signed)
 Patient arrived stating he noticed a small bump on his penis. Has had a new sexual partner, wants to be screened for STDs.

## 2024-04-05 NOTE — Discharge Instructions (Addendum)
 Thank for letting us  evaluate you today.  Your results will be available in 24-48 hours.  Will call you if you have a positive result requiring treatment.  You may also review your results on your MyChart.  I provided you with primary care provider for routine medical complaints, annual visits  Return to Emergency Department if you experience significant worsening symptoms

## 2024-04-06 ENCOUNTER — Encounter: Payer: Self-pay | Admitting: Gastroenterology

## 2024-04-06 LAB — GC/CHLAMYDIA PROBE AMP (~~LOC~~) NOT AT ARMC
Chlamydia: NEGATIVE
Comment: NEGATIVE
Comment: NORMAL
Neisseria Gonorrhea: NEGATIVE

## 2024-04-06 LAB — RPR: RPR Ser Ql: NONREACTIVE

## 2024-04-08 ENCOUNTER — Ambulatory Visit (HOSPITAL_COMMUNITY): Payer: Self-pay

## 2024-04-08 ENCOUNTER — Encounter (HOSPITAL_COMMUNITY): Payer: Self-pay

## 2024-04-08 ENCOUNTER — Ambulatory Visit (HOSPITAL_COMMUNITY)
Admission: RE | Admit: 2024-04-08 | Discharge: 2024-04-08 | Disposition: A | Discharge status: Home / Self Care | Payer: Self-pay | Source: Ambulatory Visit | Attending: Family Medicine | Admitting: Family Medicine

## 2024-04-08 VITALS — BP 124/85 | HR 102 | Temp 98.5°F | Resp 16

## 2024-04-08 DIAGNOSIS — N489 Disorder of penis, unspecified: Secondary | ICD-10-CM | POA: Insufficient documentation

## 2024-04-08 NOTE — Discharge Instructions (Signed)
 You were seen today for a penile lesion.  I did a swab for herpes today, and will be resulted in several days.  You will see this on mychart and you will be notified of positive.  This does not appear herpetic or infectious at this time.  I recommend you keep an eye on it and it will likely resolve over the next several weeks.  Please return if it worsens with redness, pain, or drainage.

## 2024-04-08 NOTE — ED Provider Notes (Signed)
 [facility]    CSN: [id number] Arrival date & time: 04/08/24  1219      History   Chief Complaint Chief Complaint  Patient presents with   Wound Check    HPI Adam Guzman is a 28 y.o. male.    Wound Check  Patient is here for a wound to the shaft of the penis.  He noted it about 5 days ago.  He was seen in the ER.  Had gc/chamydia, hiv/rpr done where where negative.  It states it had a black scab which has fallen off.  No pain, tenderness or drainage is noted.  He is just worried about what this could be.   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 Authorizing Provider  famotidine  (PEPCID ) 20 MG tablet Take 1 tablet (20 mg total) by mouth 2 (two) times daily. 01/28/24  Yes Quillen, Michael, MD  polyethylene glycol (MIRALAX / GLYCOLAX) 17 g packet Take 17 g by mouth as needed.    [provider]  triamcinolone  ointment (KENALOG ) 0.5 % Apply 1 Application topically 2 (two) times daily. 3 weeks 12/31/23   Bryan Bianchi, MD    Family History Family History  Problem Relation Age of Onset   Healthy Mother    Cancer Father        Died at 9   Colon cancer Neg Hx     Esophageal cancer Neg Hx    Stomach cancer Neg Hx     Social History Social History   Tobacco Use   Smoking status: Every Day    Current packs/day: 0.50    Types: Cigarettes    Passive exposure: Current   Smokeless tobacco: Never  Vaping Use   Vaping status: Every Day   Substances: Nicotine, Flavoring  Substance Use Topics   Alcohol use: Not Currently    Comment: occasionally   Drug use: No     Allergies   Patient has no known allergies.   Review of Systems Review of Systems  HENT: Negative.    Respiratory: Negative.    Cardiovascular: Negative.   Gastrointestinal: Negative.   Musculoskeletal: Negative.      Physical Exam Triage Vital Signs ED Triage Vitals  Encounter Vitals Group     BP 04/08/24 1230 124/85     Girls Systolic BP Percentile --      Girls Diastolic BP Percentile --      Boys Systolic BP Percentile --      Boys Diastolic BP Percentile --      Pulse Rate 04/08/24 1230 (!) 102     Resp 04/08/24 1230 16     Temp 04/08/24 1230 98.5 F (36.9 C)  Temp Source 04/08/24 1230 Oral     SpO2 04/08/24 1230 97 %     Weight --      Height --      Head Circumference --      Peak Flow --      Pain Score 04/08/24 1231 0     Pain Loc --      Pain Education --      Exclude from Growth Chart --    No data found.  Updated Vital Signs BP 124/85   Pulse (!) 102   Temp 98.5 F (36.9 C) (Oral)   Resp 16   SpO2 97%   Visual Acuity Right Eye Distance:   Left Eye Distance:   Bilateral Distance:    Right Eye Near:   Left Eye Near:    Bilateral Near:     Physical Exam Constitutional:      Appearance: He is normal weight.  Genitourinary:    Comments: Chaperone present;  To the right side of the shaft of the penis is a small scabbed wound;  no redness/warmth/drainage/tenderness noted Neurological:     General: No focal deficit present.     Mental Status: He is alert.  Psychiatric:        Mood and Affect: Mood normal.      UC Treatments /  Results  Labs (all labs ordered are listed, but only abnormal results are displayed) Labs Reviewed  HSV 1/2 PCR (SURFACE)    EKG   Radiology No results found.  Procedures Procedures (including critical care time)  Medications Ordered in UC Medications - No data to display  Initial Impression / Assessment and Plan / UC Course  I have reviewed the triage vital signs and the nursing notes.  Pertinent labs & imaging results that were available during my care of the patient were reviewed by me and considered in my medical decision making (see chart for details).   Final Clinical Impressions(s) / UC Diagnoses   Final diagnoses:  Penile lesion     Discharge Instructions      You were seen today for a penile lesion.  I did a swab for herpes today, and will be resulted in several days.  You will see this on mychart and you will be notified of positive.  This does not appear herpetic or infectious at this time.  I recommend you keep an eye on it and it will likely resolve over the next several weeks.  Please return if it worsens with redness, pain, or drainage.    ED Prescriptions   None    PDMP not reviewed this encounter.   Darral Longs, MD 04/08/24 1250

## 2024-04-08 NOTE — ED Triage Notes (Addendum)
 Pt reports penile lesion onset 5 days ago; went to ED and had negative results for cyto, HIV, RPR. Pt wishes to have wound checked and wondering what to do with it. Denies any drainage from wound. Denies fevers.

## 2024-04-09 ENCOUNTER — Ambulatory Visit (HOSPITAL_COMMUNITY): Payer: Self-pay

## 2024-04-09 LAB — HSV 1/2 PCR (SURFACE)
HSV-1 DNA: NOT DETECTED
HSV-2 DNA: DETECTED — AB

## 2024-04-09 MED ORDER — VALACYCLOVIR HCL 1 G PO TABS: 1000.0000 mg | ORAL_TABLET | Freq: Two times a day (BID) | ORAL | 0 refills | Status: AC

## 2024-04-11 ENCOUNTER — Emergency Department (HOSPITAL_COMMUNITY)
Admission: EM | Admit: 2024-04-11 | Discharge: 2024-04-11 | Discharge status: Against Medical Advice | Payer: Self-pay | Attending: Emergency Medicine | Admitting: Emergency Medicine

## 2024-04-11 ENCOUNTER — Encounter (HOSPITAL_COMMUNITY): Payer: Self-pay

## 2024-04-11 ENCOUNTER — Other Ambulatory Visit: Payer: Self-pay

## 2024-04-11 ENCOUNTER — Ambulatory Visit (HOSPITAL_COMMUNITY)
Admission: EM | Admit: 2024-04-11 | Discharge: 2024-04-11 | Disposition: A | Discharge status: Home / Self Care | Payer: Self-pay

## 2024-04-11 ENCOUNTER — Encounter (HOSPITAL_COMMUNITY): Payer: Self-pay | Admitting: Pharmacy Technician

## 2024-04-11 DIAGNOSIS — Z5321 Procedure and treatment not carried out due to patient leaving prior to being seen by health care provider: Secondary | ICD-10-CM | POA: Insufficient documentation

## 2024-04-11 DIAGNOSIS — N4889 Other specified disorders of penis: Secondary | ICD-10-CM | POA: Insufficient documentation

## 2024-04-11 DIAGNOSIS — R1084 Generalized abdominal pain: Secondary | ICD-10-CM

## 2024-04-11 DIAGNOSIS — B009 Herpesviral infection, unspecified: Secondary | ICD-10-CM

## 2024-04-11 NOTE — ED Triage Notes (Signed)
 Patient here today with c/o abd pain since yesterday. Patient states that the pain was a 10/10 last night but has improved since last night. Patient also has some h/o constipation that has been going on for a while now. He takes Milk of Magnesia with some relief.   Patient states that he was recently diagnosed with HSV.

## 2024-04-11 NOTE — ED Provider Notes (Signed)
 [facility]    CSN: [id number] Arrival date & time: 04/11/24  1615      History   Chief Complaint Chief Complaint  Patient presents with   Abdominal Pain    HPI Adam Guzman is a 28 y.o. male presenting today with concerns about his new diagnosis of HSV-2 infection and sudden onset of lower abdominal pain yesterday.  Patient was just seen in urgent care 3 days ago for penile lesions and was officially diagnosed with HSV type II.  He was started on Valtrex .  He started this medication yesterday.  He states this diagnosis has been causing quite a bit of stress for him and especially his relationships at home.  He plans to follow-up with his primary care this coming week.  Patient reports that yesterday evening he had sudden onset of abdominal pain that was really significant and he rated it a 10 out of 10.  He thought about going to the emergency department.  States he had a bowel movement and symptoms mostly resolved after having that bowel movement.  Some lingering abdominal pain today, but nothing like yesterday.  No fever or chills.  No dysuria.  No nausea or vomiting.  No blood in the stool.  No other symptoms.    Past Medical History:  Diagnosis Date   Neurofibromatosis 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 Authorizing Provider  valACYclovir  (VALTREX ) 1000 MG tablet Take 1 tablet (1,000 mg total) by mouth 2 (two) times daily for 7 days. 04/09/24 04/16/24  Vonna Sharlet POUR, MD    Family History Family History  Problem Relation Age of Onset   Healthy Mother    Cancer Father        Died at 31   Colon cancer Neg Hx    Esophageal cancer Neg Hx    Stomach cancer Neg Hx     Social History Social History   Tobacco Use   Smoking status: Every Day    Current packs/day: 0.50    Types: Cigarettes    Passive exposure: Current   Smokeless tobacco: Never  Vaping Use   Vaping status: Every Day   Substances: Nicotine, Flavoring  Substance Use Topics   Alcohol use: Not Currently    Comment: occasionally   Drug use: No     Allergies   Patient has no known allergies.   Review of Systems Review of Systems  Gastrointestinal:  Positive for abdominal pain.   REFER TO HPI FOR PERTINENT POSITIVES AND NEGATIVES    Physical Exam Triage Vital Signs ED Triage Vitals  Encounter Vitals Group     BP 04/11/24 1726 135/85     Girls Systolic BP Percentile --      Girls  Diastolic BP Percentile --      Boys Systolic BP Percentile --      Boys Diastolic BP Percentile --      Pulse Rate 04/11/24 1726 93     Resp 04/11/24 1726 16     Temp 04/11/24 1726 98.5 F (36.9 C)     Temp Source 04/11/24 1726 Oral     SpO2 04/11/24 1726 97 %     Weight --      Height --      Head Circumference --      Peak Flow --      Pain Score 04/11/24 1729 5     Pain Loc --      Pain Education --      Exclude from Growth Chart --    No data found.  Updated Vital Signs BP 135/85 (BP Location: Right Arm)   Pulse 93   Temp 98.5 F (36.9 C) (Oral)   Resp 16   SpO2 97%   Visual Acuity Right Eye Distance:   Left Eye Distance:   Bilateral Distance:    Right Eye Near:   Left Eye Near:    Bilateral Near:     Physical  Exam Vitals and nursing note reviewed.  Constitutional:      Appearance: He is well-developed.  Cardiovascular:     Rate and Rhythm: Normal rate.  Pulmonary:     Effort: Pulmonary effort is normal.     Breath sounds: Normal breath sounds.  Abdominal:     General: Abdomen is flat. Bowel sounds are normal.     Palpations: Abdomen is soft.     Tenderness: There is no abdominal tenderness. There is no right CVA tenderness, left CVA tenderness, guarding or rebound.  Skin:    General: Skin is warm.     Findings: No rash.  Neurological:     General: No focal deficit present.     Mental Status: He is alert.  Psychiatric:        Mood and Affect: Mood normal.      UC Treatments / Results  Labs (all labs ordered are listed, but only abnormal results are displayed) Labs Reviewed - No data to display  EKG   Radiology No results found.  Procedures Procedures (including critical care time)  Medications Ordered in UC Medications - No data to display  Initial Impression / Assessment and Plan / UC Course  I have reviewed the triage vital signs and the nursing notes.  Pertinent labs & imaging results that were available during my care of the patient were reviewed by me and considered in my medical decision making (see chart for details).     Anxious appearing 28 year old male, otherwise no acute distress.  Abdominal exam is benign.  No red flags on exam.  Clinical impression is that he has been very emotionally worked up about the HSV-2 diagnosis.  All questions were answered for him today.  He will continue on the Valtrex .  He will follow-up with his primary care this coming week.  Talked with him about a well-balanced, fiber full diet.   Strict ER precautions regarding the abdominal pain, but for now it seems that symptoms have resolved.  I did provide an work note for him to have some rest tomorrow.  Patient was appreciative.  Return to care precautions advised. Final Clinical  Impressions(s) / UC Diagnoses   Final diagnoses:  HSV-2 infection  Generalized abdominal pain     Discharge Instructions  Please take your medication as directed.  Always use condoms.  Talk with your primary care about potential daily Valtrex  prescription for future prevention.  Keep stool soft and regular. Take fiber daily. Eat a well-balanced diet.  Follow-up as needed.      ED Prescriptions   None    PDMP not reviewed this encounter.   AllwardtMardy HERO, PA-C 04/11/24 1830

## 2024-04-11 NOTE — ED Triage Notes (Signed)
 Pt here with reports of wanting to be retested for herpes. Endorses a small lesion on penis. Denies pain.

## 2024-04-11 NOTE — Discharge Instructions (Signed)
 Please take your medication as directed.  Always use condoms.  Talk with your primary care about potential daily Valtrex  prescription for future prevention.  Keep stool soft and regular. Take fiber daily. Eat a well-balanced diet.  Follow-up as needed.

## 2024-04-17 ENCOUNTER — Ambulatory Visit (INDEPENDENT_AMBULATORY_CARE_PROVIDER_SITE_OTHER): Payer: Self-pay

## 2024-04-17 VITALS — BP 126/80 | HR 93 | Temp 98.5°F | Ht 71.0 in | Wt 140.2 lb

## 2024-04-17 DIAGNOSIS — W57XXXA Bitten or stung by nonvenomous insect and other nonvenomous arthropods, initial encounter: Secondary | ICD-10-CM

## 2024-04-17 DIAGNOSIS — B009 Herpesviral infection, unspecified: Secondary | ICD-10-CM

## 2024-04-17 NOTE — Progress Notes (Signed)
    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 No bleeding, penile discharge, pain with ejaculation, or urinary symptoms. Has not been sexually active since 8/12 but the partner has not been responding to messages since then. Two sexually partners in the last 3 months, both women, no history of MSM activity. Reports some pain like 1/10 here and there.  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 Normal S1/S2. No murmurs, rubs, or gallops appreciated. Lungs: Clear bilaterally to ascultation.  Abdomen: Normoactive bowel sounds. No tenderness to deep or light palpation. No rebound or guarding.   Psych: Pleasant and appropriate  Skin: Right arm: Small 6 mm excoriation with old granulation tissue at base with mildly raised border.  Nontender, nonerythematous. Penis: Scattered small nonraised ulcerated lesions on right side of shaft of penis, see attached photo  ASSESSMENT/PLAN:   Assessment & Plan HSV-2 infection Stable, completed Valtrex  treatment with no lapses yesterday.  Pain has resolved but ulcerations remain.  Discussed that this is the normal healing process of the lesions and there is little concern for needing to prolong treatment course -Observation, if lesions do not begin to heal by 2-week follow-up we will reassess. -Follow-up in 6 months or as needed Bug bite, initial encounter Uncertain timeline or exposure.  Presumed to be secondary to bug bite as he works in [employer]ural [employer]. -Bacitracin and bandage changes twice a day     Fairy Amy, MD [facility]

## 2024-04-17 NOTE — Patient Instructions (Addendum)
 Thank you for visiting the clinic today, it was good to see you!  Please always bring your medication bottles  In today's visit we discussed:  Arm wound: put bacitracin on the wound twice a day when you change the bandage.  Wash it with soap and water when you are in the shower  HSV 2: Since the pain has resolved, we have given you treatment to suppress the virus and the lesion will slowly heal.  I recommend abstaining from sexual activity until they have fully resolved, but you will not need further treatment at this time.  Please follow-up in 2 weeks  For any questions, please call the office at [phone] or send me a message in MyChart. Have a great day!  -Fairy Amy, MD  [facility] Resident, PGY-1

## 2024-04-20 ENCOUNTER — Telehealth (HOSPITAL_COMMUNITY): Payer: Self-pay

## 2024-04-20 NOTE — Telephone Encounter (Signed)
 Returned pt's call. Pt states saw his PCP d/t still having HSV outbreak. States out of his medication. States hasn't received a call back from PCP. Pt encourage to call PCP in regards of refill. All questions answered.

## 2024-04-23 ENCOUNTER — Telehealth: Payer: Self-pay

## 2024-04-23 NOTE — Telephone Encounter (Signed)
 Called patient and advised of provider message.   Patient is confused as to why he needs to be re-evaluated as he has a  HSV diagnosis.   Patient is anxious that provider is concerned that something else is going on.   Scheduled patient follow up visit on 9/2, as this is our first available.   Chiquita JAYSON English, RN

## 2024-04-23 NOTE — Telephone Encounter (Signed)
 Patient calls nurse line regarding refill on Valtrex . He reports that he finished medication a little over a week ago. He was unsure if he is supposed to continue a daily dosage or how he should proceed.   Reports that he is currently experiencing outbreak, with a few lesions on penis.   Denies drainage.   Advised that I would forward to PCP for next steps.   Preferred pharmacy is [facility].   Chiquita JAYSON English, RN

## 2024-04-28 ENCOUNTER — Ambulatory Visit (INDEPENDENT_AMBULATORY_CARE_PROVIDER_SITE_OTHER): Payer: Self-pay | Admitting: Family Medicine

## 2024-04-28 VITALS — BP 139/88 | HR 102 | Ht 71.0 in | Wt 139.0 lb

## 2024-04-28 DIAGNOSIS — B009 Herpesviral infection, unspecified: Secondary | ICD-10-CM | POA: Insufficient documentation

## 2024-04-28 MED ORDER — VALACYCLOVIR HCL 500 MG PO TABS: 500.0000 mg | ORAL_TABLET | Freq: Two times a day (BID) | ORAL | 0 refills | Status: DC

## 2024-04-28 NOTE — Progress Notes (Signed)
    SUBJECTIVE:   CHIEF COMPLAINT / HPI: HSV  HSV2 -Previously treated with course of Valtrex  -Seen in our clinic last week -Went to get refill of Valtrex  -Was denied refill -Was told he need to see primary doctor -Lesions are not bothering him at all anymore -Not painful  PERTINENT  PMH / PSH: NF1, Migraines  OBJECTIVE:   BP 139/88   Pulse (!) 102   Ht 5' 11 (1.803 m)   Wt 139 lb (63 kg)   SpO2 100%   BMI 19.39 kg/m   General: NAD, well appearing Neuro: A&O Respiratory: normal WOB on RA Extremities: Moving all 4 extremities equally GU: mild macules of hypopigmentation along penile shaft, no crusting or erythema  ASSESSMENT/PLAN:   Assessment & Plan HSV-2 infection Lesions well healed. Provided with as needed valtrex  if recurrent outbreaks occur. Counseled regarding safe sexual practices.  Return if symptoms worsen or fail to improve.  Adam Provencal, MD [facility]

## 2024-04-28 NOTE — Patient Instructions (Signed)
 It was great to see you! Thank you for allowing me to participate in your care!  Our plans for today:  - I have sent your Valtrex  prescription to your pharmacy - Please only take this as needed if you have new genital lesions   Please arrive 15 minutes PRIOR to your next scheduled appointment time! If you do not, this affects OTHER patients' care.  Take care and seek immediate care sooner if you develop any concerns.   Ozell Provencal, MD, PGY-3 [facility] 8:40 AM 04/28/2024  [facility]

## 2024-04-28 NOTE — Assessment & Plan Note (Signed)
 Lesions well healed. Provided with as needed valtrex  if recurrent outbreaks occur. Counseled regarding safe sexual practices.

## 2024-06-11 ENCOUNTER — Ambulatory Visit (HOSPITAL_COMMUNITY)
Admission: EM | Admit: 2024-06-11 | Discharge: 2024-06-11 | Disposition: A | Discharge status: Home / Self Care | Payer: Self-pay | Attending: Emergency Medicine | Admitting: Emergency Medicine

## 2024-06-11 ENCOUNTER — Encounter (HOSPITAL_COMMUNITY): Payer: Self-pay | Admitting: *Deleted

## 2024-06-11 ENCOUNTER — Other Ambulatory Visit: Payer: Self-pay

## 2024-06-11 DIAGNOSIS — L0211 Cutaneous abscess of neck: Secondary | ICD-10-CM

## 2024-06-11 MED ORDER — DOXYCYCLINE HYCLATE 100 MG PO CAPS: 100.0000 mg | ORAL_CAPSULE | Freq: Two times a day (BID) | ORAL | 0 refills | Status: DC

## 2024-06-11 NOTE — Discharge Instructions (Signed)
 Starting doxycycline  twice daily for 10 days for abscess. Apply warm wet washcloth to the abscess on your neck to help decrease swelling and promote drainage. Alternate between 650 mg of Tylenol  and 400 to 600 mg of ibuprofen  every 6-8 hours as needed for pain. Return here as needed.

## 2024-06-11 NOTE — ED Provider Notes (Signed)
 [facility]    CSN: [id number] Arrival date & time: 06/11/24  9167      History   Chief Complaint Chief Complaint  Patient presents with   Abscess    HPI Adam Guzman is a 28 y.o. male.   Patient presents with abscess to the back of his neck that he first noticed on 10/12.  Patient states that it started out as a small pimple and has become larger and more painful over the last few days.  Patient states that he has had some drainage from this area.  Patient applied a pimple patch over the area which is helped to drain some of the purulent drainage.  Denies any fever, body aches, chills, and weakness.  The history is provided by the patient and medical records.  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 Authorizing Provider  doxycycline  (VIBRAMYCIN ) 100 MG capsule Take 1 capsule (100 mg total) by mouth 2 (two) times daily. 06/11/24  Yes Johnie Rumaldo LABOR, NP    Family History Family History  Problem Relation Age of Onset   Healthy Mother    Cancer Father        Died at 65   Colon cancer Neg Hx    Esophageal cancer Neg Hx    Stomach cancer Neg Hx     Social History Social History   Tobacco Use   Smoking status: Every Day    Current packs/day: 0.50    Types: Cigarettes    Passive exposure:  Current   Smokeless tobacco: Never  Vaping Use   Vaping status: Every Day   Substances: Nicotine, Flavoring  Substance Use Topics   Alcohol use: Not Currently    Comment: occasionally   Drug use: No     Allergies   Patient has no known allergies.   Review of Systems Review of Systems  Per HPI  Physical Exam Triage Vital Signs ED Triage Vitals  Encounter Vitals Group     BP 06/11/24 0956 121/81     Girls Systolic BP Percentile --      Girls Diastolic BP Percentile --      Boys Systolic BP Percentile --      Boys Diastolic BP Percentile --      Pulse Rate 06/11/24 0956 78     Resp 06/11/24 0956 18     Temp 06/11/24 0956 98.2 F (36.8 C)     Temp src --      SpO2 06/11/24 0956 98 %     Weight --      Height --      Head Circumference --      Peak Flow --      Pain Score 06/11/24 0955 8     Pain Loc --  Pain Education --      Exclude from Growth Chart --    No data found.  Updated Vital Signs BP 121/81   Pulse 78   Temp 98.2 F (36.8 C)   Resp 18   SpO2 98%   Visual Acuity Right Eye Distance:   Left Eye Distance:   Bilateral Distance:    Right Eye Near:   Left Eye Near:    Bilateral Near:     Physical Exam Vitals and nursing note reviewed.  Constitutional:      General: He is awake. He is not in acute distress.    Appearance: Normal appearance. He is well-developed and well-groomed. He is not ill-appearing.  Neck:   Skin:    General: Skin is warm and dry.     Findings: Abscess present.     Comments: Indurated abscess measuring approximately 3 cm in diameter noted to right posterior neck that is actively draining purulent drainage  Neurological:     Mental Status: He is alert.  Psychiatric:        Behavior: Behavior is cooperative.      UC Treatments / Results  Labs (all labs ordered are listed, but only abnormal results are displayed) Labs Reviewed - No data to display  EKG   Radiology No results  found.  Procedures Procedures (including critical care time)  Medications Ordered in UC Medications - No data to display  Initial Impression / Assessment and Plan / UC Course  I have reviewed the triage vital signs and the nursing notes.  Pertinent labs & imaging results that were available during my care of the patient were reviewed by me and considered in my medical decision making (see chart for details).     Patient is overall well-appearing.  Vitals are stable.  Deferred I&D due to lack of fluctuance and active drainage.  Prescribe doxycycline  for abscess coverage.  Discussed use of warm compresses.  Recommended Tylenol  and ibuprofen  as needed for pain.  Discussed follow-up and return precautions. Final Clinical Impressions(s) / UC Diagnoses   Final diagnoses:  Abscess of neck     Discharge Instructions      Starting doxycycline  twice daily for 10 days for abscess. Apply warm wet washcloth to the abscess on your neck to help decrease swelling and promote drainage. Alternate between 650 mg of Tylenol  and 400 to 600 mg of ibuprofen  every 6-8 hours as needed for pain. Return here as needed.   ED Prescriptions     Medication Sig Dispense Auth. Provider   doxycycline  (VIBRAMYCIN ) 100 MG capsule Take 1 capsule (100 mg total) by mouth 2 (two) times daily. 20 capsule Johnie Flaming A, NP      PDMP not reviewed this encounter.   Johnie Flaming A, NP 06/11/24 1025

## 2024-06-11 NOTE — ED Triage Notes (Signed)
 PT reports since Sunday he has had a abscess on back opf neck. Pt reported site started out small but is now larger and painful. Pt has a medicate patch over abscess.

## 2024-06-13 ENCOUNTER — Encounter (HOSPITAL_COMMUNITY): Payer: Self-pay

## 2024-06-13 ENCOUNTER — Ambulatory Visit (HOSPITAL_COMMUNITY)
Admission: RE | Admit: 2024-06-13 | Discharge: 2024-06-13 | Disposition: A | Discharge status: Home / Self Care | Payer: Self-pay | Source: Ambulatory Visit | Attending: Internal Medicine | Admitting: Internal Medicine

## 2024-06-13 ENCOUNTER — Other Ambulatory Visit: Payer: Self-pay

## 2024-06-13 VITALS — BP 101/64 | HR 102 | Temp 99.4°F | Resp 18

## 2024-06-13 DIAGNOSIS — L089 Local infection of the skin and subcutaneous tissue, unspecified: Secondary | ICD-10-CM

## 2024-06-13 DIAGNOSIS — L720 Epidermal cyst: Secondary | ICD-10-CM

## 2024-06-13 MED ORDER — LIDOCAINE-EPINEPHRINE 1 %-1:100000 IJ SOLN
INTRAMUSCULAR | Status: AC
  Filled 2024-06-13: qty 1

## 2024-06-13 MED ORDER — IBUPROFEN 800 MG PO TABS: 800.0000 mg | ORAL_TABLET | Freq: Three times a day (TID) | ORAL | 0 refills | Status: AC

## 2024-06-13 NOTE — ED Provider Notes (Signed)
 [facility]    CSN: [id number] Arrival date & time: 06/13/24  1328      History   Chief Complaint Chief Complaint  Patient presents with   Abscess    On the back of my neck and is in extreme pain - Entered by patient   Appointment    1:30    HPI Adam Guzman is a 28 y.o. male.   28 year old male presents urgent care with complaints of a worsening abscess on the back of his neck.  He was seen 2 days ago for an infection in the area and started on doxycycline .  He reports that he has been taking the antibiotics but the area has continued to get bigger and he is much more painful now.  He denies any fevers or chills.   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 Authorizing Provider  doxycycline  (VIBRAMYCIN ) 100 MG capsule Take 1 capsule (100 mg total) by mouth 2 (two) times daily. 06/11/24   Johnie Rumaldo LABOR, NP    Family History Family History  Problem Relation Age of Onset   Healthy Mother    Cancer Father        Died at 49   Colon cancer Neg Hx    Esophageal cancer Neg Hx    Stomach cancer Neg Hx     Social History Social History   Tobacco Use   Smoking status: Every Day    Current packs/day: 0.50    Types: Cigarettes    Passive exposure:  Current   Smokeless tobacco: Never  Vaping Use   Vaping status: Every Day   Substances: Nicotine, Flavoring  Substance Use Topics   Alcohol use: Not Currently    Comment: occasionally   Drug use: No     Allergies   Patient has no known allergies.   Review of Systems Review of Systems  Constitutional:  Negative for chills and fever.  HENT:  Negative for ear pain and sore throat.   Eyes:  Negative for pain and visual disturbance.  Respiratory:  Negative for cough and shortness of breath.   Cardiovascular:  Negative for chest pain and palpitations.  Gastrointestinal:  Negative for abdominal pain and vomiting.  Genitourinary:  Negative for dysuria and hematuria.  Musculoskeletal:  Negative for arthralgias and back pain.  Skin:  Positive for color change. Negative for rash.       Abscess on the back of the neck  Neurological:  Negative for seizures and syncope.  All other systems reviewed and are negative.    Physical Exam Triage Vital Signs ED Triage Vitals  Encounter Vitals Group     BP 06/13/24 1350 101/64     Girls Systolic BP Percentile --  Girls Diastolic BP Percentile --      Boys Systolic BP Percentile --      Boys Diastolic BP Percentile --      Pulse Rate 06/13/24 1350 (!) 102     Resp 06/13/24 1350 18     Temp 06/13/24 1350 99.4 F (37.4 C)     Temp Source 06/13/24 1350 Oral     SpO2 06/13/24 1350 96 %     Weight --      Height --      Head Circumference --      Peak Flow --      Pain Score 06/13/24 1349 8     Pain Loc --      Pain Education --      Exclude from Growth Chart --    No data found.  Updated Vital Signs BP 101/64 (BP Location: Right Arm)   Pulse (!) 102   Temp 99.4 F (37.4 C) (Oral)   Resp 18   SpO2 96%   Visual Acuity Right Eye Distance:   Left Eye Distance:   Bilateral Distance:    Right Eye Near:   Left Eye Near:    Bilateral Near:     Physical Exam Vitals and nursing note reviewed.  Constitutional:       General: He is not in acute distress.    Appearance: He is well-developed.  HENT:     Head: Normocephalic and atraumatic.  Eyes:     Conjunctiva/sclera: Conjunctivae normal.  Neck:   Cardiovascular:     Rate and Rhythm: Normal rate and regular rhythm.     Heart sounds: No murmur heard. Pulmonary:     Effort: Pulmonary effort is normal. No respiratory distress.     Breath sounds: Normal breath sounds.  Abdominal:     Palpations: Abdomen is soft.     Tenderness: There is no abdominal tenderness.  Musculoskeletal:        General: No swelling.     Cervical back: Neck supple.  Skin:    General: Skin is warm and dry.     Capillary Refill: Capillary refill takes less than 2 seconds.  Neurological:     Mental Status: He is alert.  Psychiatric:        Mood and Affect: Mood normal.      UC Treatments / Results  Labs (all labs ordered are listed, but only abnormal results are displayed) Labs Reviewed - No data to display  EKG   Radiology No results found.  Procedures Incision and Drainage  Date/Time: 06/13/2024 2:12 PM  Performed by: Teresa Almarie LABOR, PA-C Authorized by: Teresa Almarie LABOR, PA-C   Consent:    Consent obtained:  Verbal   Consent given by:  Patient   Risks discussed:  Bleeding, incomplete drainage, pain and damage to other organs   Alternatives discussed:  No treatment Universal protocol:    Procedure explained and questions answered to patient or proxy's satisfaction: yes     Required blood products, implants, devices, and special equipment available: yes     Site/side marked: yes     Immediately prior to procedure, a time out was called: yes     Patient identity confirmed:  Verbally with patient Location:    Type:  Cyst   Size:  3 cm   Location:  Neck Pre-procedure details:    Skin preparation:  Betadine Anesthesia:    Anesthesia method:  Local infiltration   Local anesthetic:  Lidocaine  1% WITH epi  Procedure type:    Complexity:   Simple Procedure details:    Incision types:  Single straight   Incision depth:  Subcutaneous   Wound management:  Probed and deloculated, irrigated with saline and extensive cleaning   Drainage:  Purulent   Drainage amount:  Moderate   Wound treatment:  Wound left open   Packing materials:  None Post-procedure details:    Procedure completion:  Tolerated well, no immediate complications  (including critical care time)  Medications Ordered in UC Medications - No data to display  Initial Impression / Assessment and Plan / UC Course  I have reviewed the triage vital signs and the nursing notes.  Pertinent labs & imaging results that were available during my care of the patient were reviewed by me and considered in my medical decision making (see chart for details).     Infected epithelial inclusion cyst  Epidermal cyst of neck  Incision and drainage done today of infected epidermal inclusion cyst of the posterior neck.  Leave the current dressing in place until tonight then may remove the dressing and wash the area with soap and water.  Replace with a clean dry dressing.  Change the dressing twice daily until the area is completely healed which will likely take approximately 4 to 5 days.  Finish out the antibiotics as prescribed.  As this is a cyst, you will likely notice a small knot in the area once it completely heals.  In order for this to be completely resolved you would have to follow-up with a general surgeon and have excision of this done.  This can be done at your convenience.  We have attached the information for the general surgeons.  Return to urgent care as needed.  Final Clinical Impressions(s) / UC Diagnoses   Final diagnoses:  Infected epithelial inclusion cyst  Epidermal cyst of neck     Discharge Instructions      Incision and drainage done today of infected epidermal inclusion cyst of the posterior neck.  Leave the current dressing in place until tonight then  may remove the dressing and wash the area with soap and water.  Replace with a clean dry dressing.  Change the dressing twice daily until the area is completely healed which will likely take approximately 4 to 5 days.  Finish out the antibiotics as prescribed.  As this is a cyst, you will likely notice a small knot in the area once it completely heals.  In order for this to be completely resolved you would have to follow-up with a general surgeon and have excision of this done.  This can be done at your convenience.  We have attached the information for the general surgeons.  Return to urgent care as needed.     ED Prescriptions   None    PDMP not reviewed this encounter.   Teresa Almarie LABOR, [place] 06/13/24 1415

## 2024-06-13 NOTE — ED Triage Notes (Signed)
 Patient was seen 10/16 for the same   States this does not seem to be improving and had to call out of work for this issue.  States he is taking antibiotic

## 2024-06-13 NOTE — Discharge Instructions (Addendum)
 Incision and drainage done today of infected epidermal inclusion cyst of the posterior neck.  Leave the current dressing in place until tonight then may remove the dressing and wash the area with soap and water.  Replace with a clean dry dressing.  Change the dressing twice daily until the area is completely healed which will likely take approximately 4 to 5 days.  Finish out the antibiotics as prescribed.  Ibuprofen  800 mg every 8 hours as needed for pain.  As this is a cyst, you will likely notice a small knot in the area once it completely heals.  In order for this to be completely resolved you would have to follow-up with a general surgeon and have excision of this done.  This can be done at your convenience.  We have attached the information for the general surgeons.  Return to urgent care as needed.

## 2024-06-29 ENCOUNTER — Other Ambulatory Visit: Payer: Self-pay | Admitting: Family Medicine

## 2024-06-29 DIAGNOSIS — B009 Herpesviral infection, unspecified: Secondary | ICD-10-CM

## 2024-07-03 ENCOUNTER — Ambulatory Visit: Payer: Self-pay | Admitting: Family Medicine

## 2024-07-06 ENCOUNTER — Ambulatory Visit (INDEPENDENT_AMBULATORY_CARE_PROVIDER_SITE_OTHER): Payer: Self-pay

## 2024-07-06 VITALS — BP 135/86 | HR 95 | Ht 71.0 in | Wt 147.6 lb

## 2024-07-06 DIAGNOSIS — B009 Herpesviral infection, unspecified: Secondary | ICD-10-CM

## 2024-07-06 MED ORDER — VALACYCLOVIR HCL 500 MG PO TABS: 500.0000 mg | ORAL_TABLET | Freq: Two times a day (BID) | ORAL | Status: AC

## 2024-07-06 NOTE — Progress Notes (Signed)
    SUBJECTIVE:   CHIEF COMPLAINT / HPI:   HSV2  - Diagnosed a few months ago with HSV2. 2 weeks ago started having penile lesions again, got valtrex  refill on 11/3. Today symptoms have improved. Lesions are no longer painful, no drainage - denies fevers, chills, new partners  PERTINENT  PMH / PSH: HSV  OBJECTIVE:   BP 135/86   Pulse 95   Ht 5' 11 (1.803 m)   Wt 147 lb 9.6 oz (67 kg)   SpO2 100%   BMI 20.59 kg/m   Physical Exam General: Alert, conversant, cooperative. No acute distress.  HEENT: PERRL. EOMI. MMM.  Skin: Warm. Dry. No rashes. No icterus.   GU: very small area of fluctuance at right groin crease, mild warmth and tenderness. Scattered healing ulcers on shaft and head of penis, non tender, no drainage.   Chaperone present for exam ASSESSMENT/PLAN:   Assessment & Plan Hx of HSV-2 Healing well, ensured pt has refills of valtrex  for outbreaks. No need to repeat valtrex  today. Discussed safe sex practices. Area of induration in groin is not a lesion but likely small abscess/ingrown hair. Encouraged to use warm compress.      Milda LITTIE Deed, MD [facility]

## 2024-07-06 NOTE — Patient Instructions (Addendum)
 It was good to see you today.   Please bring ALL of your medications with you to every visit.    Today we talked about: HSV2 If symptoms return, call for valtrex  refill.     Thank you for choosing [facility]. Please refer to your mychart for specifics regarding today's visit or future appointments.

## 2024-07-06 NOTE — Assessment & Plan Note (Addendum)
 Healing well, ensured pt has refills of valtrex  for outbreaks. No need to repeat valtrex  today. Discussed safe sex practices. Area of induration in groin is not a lesion but likely small abscess/ingrown hair. Encouraged to use warm compress.

## 2024-07-07 ENCOUNTER — Encounter: Payer: Self-pay | Admitting: Family Medicine

## 2024-07-27 ENCOUNTER — Ambulatory Visit (HOSPITAL_COMMUNITY): Payer: Self-pay

## 2024-08-15 ENCOUNTER — Ambulatory Visit (HOSPITAL_COMMUNITY): Payer: Self-pay

## 2024-08-17 ENCOUNTER — Ambulatory Visit (HOSPITAL_COMMUNITY): Payer: Self-pay

## 2024-08-21 ENCOUNTER — Other Ambulatory Visit: Payer: Self-pay

## 2024-08-21 DIAGNOSIS — B009 Herpesviral infection, unspecified: Secondary | ICD-10-CM

## 2024-08-24 NOTE — Telephone Encounter (Signed)
 Spoke with patient, reports that he needs another refill and I instructed him that if this therapy does not improve his symptoms then he needs reevaluation for other possible causes of his lesions.  Dr. Fairy Amy, MD [facility] Resident, PGY-1

## 2024-08-25 ENCOUNTER — Ambulatory Visit: Payer: Self-pay

## 2024-08-25 VITALS — BP 138/76 | HR 101 | Ht 71.0 in | Wt 148.8 lb

## 2024-08-25 DIAGNOSIS — Q8501 Neurofibromatosis, type 1: Secondary | ICD-10-CM

## 2024-08-25 DIAGNOSIS — B009 Herpesviral infection, unspecified: Secondary | ICD-10-CM

## 2024-08-25 DIAGNOSIS — K118 Other diseases of salivary glands: Secondary | ICD-10-CM

## 2024-08-25 MED ORDER — VALACYCLOVIR HCL 500 MG PO TABS: ORAL_TABLET | ORAL | 0 refills | Status: AC

## 2024-08-25 NOTE — Patient Instructions (Signed)
 Thank you for visiting the clinic today, it was good to see you!  Please always bring your medication bottles  In today's visit we discussed:  Neurofibromatosis: I have put in a referral for you to see ENT regarding the mass on the side of your face.  HSV 2: I have sent in a refill of the valacyclovir  for you.  Should you have new lesions with white bases that are painful to touch, you can restart taking 500 mg tablets of valacyclovir  twice a day for 3 days.  The prescription should be enough for 2 courses of treatment.  If after 1 treatment your symptoms are not improving, please schedule a follow-up visit with your PCP.  It is normal for the sores to take several weeks to heal as they scab.  Please follow-up in 6 to 8 months for yearly physical  For any questions, please call the office at [phone] or send me a message in MyChart. Have a great day!  -Fairy Amy, MD  [facility] Resident, PGY-1

## 2024-08-25 NOTE — Assessment & Plan Note (Signed)
 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  1 new and reportedly a reliable sexual partner.  Low concern for syphilis or other STI because of no ulcerations given absence of other symptoms and mildly itchy with clear scabbing of previously identified HSV 2 lesions. Refill sent in for valacyclovir  100 mg twice daily for 3 days for next flare Patient education given regarding course of HSV-2

## 2024-08-25 NOTE — Progress Notes (Signed)
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 Lesions are mildly itchy, not tender. No pus or blood. Denies fevers, chills, CP, cough. Some shortness of breath after vaping losimer, no changes in what he is vaping recently. No changes with penile discharge. No new sexual partner, has a steady girlfriend. Has never had a period where the lesions have actually resolved and have persisted for several months.  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 First noticed a few years ago, appears to be stable over that time.  No fevers or chills, ear pain, or drainage.  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  Tender to palpation at apex of nodule, nonerythematous, well-formed and mobile. Ears: Patent ear canals bilaterally, tympanic membranes clear, nonerythematous, nondistended bilaterally.  Could hear finger rub bilaterally. Neck: Supple, full range of motion Lymph nodes: No appreciable anterior or posterior cervical lymphadenopathy, no retro-ocular lymphadenopathy, or occipital lymphadenopathy Cardiovascular: Regular rate rhythm, normal S1 and S2, no murmurs rubs or gallops Respiratory: Clear to auscultation bilaterally, no wheezes or crackles Abdomen: Active bowel sounds, nondistended, nontender GU: Two 8 to 10 mm well-healing sores will not base of right side of shaft and other distal near head of penis.  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  1 new and reportedly a reliable sexual partner.  Low concern for syphilis or other STI because of no ulcerations given absence of other symptoms and mildly itchy with clear scabbing of previously identified HSV 2 lesions. 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  Given this has not previously been evaluated by surgical specialty, will plan to refer for ENT for further evaluation and management. Referral for ENT placed, to determine adequate imaging and workup    Fairy Amy, MD [facility] "

## 2024-08-25 NOTE — Assessment & Plan Note (Signed)
 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  Given this has not previously been evaluated by surgical specialty, will plan to refer for ENT for further evaluation and management. Referral for ENT placed, to determine adequate imaging and workup

## 2024-08-31 ENCOUNTER — Encounter (INDEPENDENT_AMBULATORY_CARE_PROVIDER_SITE_OTHER): Payer: Self-pay

## 2024-09-07 ENCOUNTER — Encounter (INDEPENDENT_AMBULATORY_CARE_PROVIDER_SITE_OTHER): Payer: Self-pay

## 2024-09-07 ENCOUNTER — Ambulatory Visit: Payer: Self-pay | Admitting: Family Medicine
